# Patient Record
Sex: Male | Born: 1955 | Race: Black or African American | Hispanic: No | Marital: Married | Smoking: Never smoker
Health system: Southern US, Community
[De-identification: ages and names within clinical notes are randomized; demographics above are authoritative.]

## PROBLEM LIST (undated history)

## (undated) DIAGNOSIS — R1310 Dysphagia, unspecified: Secondary | ICD-10-CM

## (undated) DIAGNOSIS — G8250 Quadriplegia, unspecified: Secondary | ICD-10-CM

## (undated) DIAGNOSIS — J449 Chronic obstructive pulmonary disease, unspecified: Secondary | ICD-10-CM

---

## 2019-06-16 ENCOUNTER — Encounter (HOSPITAL_COMMUNITY): Payer: Self-pay | Admitting: Emergency Medicine

## 2019-06-16 ENCOUNTER — Inpatient Hospital Stay (HOSPITAL_COMMUNITY)
Admission: EM | Admit: 2019-06-16 | Discharge: 2019-06-21 | DRG: 177 | Disposition: A | Discharge status: Skilled Nursing Facility | Payer: Medicare Other | Source: Skilled Nursing Facility | Attending: Internal Medicine | Admitting: Internal Medicine

## 2019-06-16 ENCOUNTER — Telehealth: Payer: Self-pay | Admitting: Emergency Medicine

## 2019-06-16 ENCOUNTER — Other Ambulatory Visit: Payer: Self-pay

## 2019-06-16 ENCOUNTER — Emergency Department (HOSPITAL_COMMUNITY): Payer: Medicare Other

## 2019-06-16 DIAGNOSIS — U071 COVID-19: Principal | ICD-10-CM

## 2019-06-16 DIAGNOSIS — L89892 Pressure ulcer of other site, stage 2: Secondary | ICD-10-CM | POA: Diagnosis not present

## 2019-06-16 DIAGNOSIS — E118 Type 2 diabetes mellitus with unspecified complications: Secondary | ICD-10-CM | POA: Diagnosis not present

## 2019-06-16 DIAGNOSIS — Z8673 Personal history of transient ischemic attack (TIA), and cerebral infarction without residual deficits: Secondary | ICD-10-CM | POA: Diagnosis not present

## 2019-06-16 DIAGNOSIS — E870 Hyperosmolality and hypernatremia: Secondary | ICD-10-CM | POA: Diagnosis not present

## 2019-06-16 DIAGNOSIS — E1165 Type 2 diabetes mellitus with hyperglycemia: Secondary | ICD-10-CM | POA: Diagnosis present

## 2019-06-16 DIAGNOSIS — J9601 Acute respiratory failure with hypoxia: Secondary | ICD-10-CM | POA: Diagnosis present

## 2019-06-16 DIAGNOSIS — Z7982 Long term (current) use of aspirin: Secondary | ICD-10-CM | POA: Diagnosis not present

## 2019-06-16 DIAGNOSIS — G8250 Quadriplegia, unspecified: Secondary | ICD-10-CM | POA: Diagnosis not present

## 2019-06-16 DIAGNOSIS — J9600 Acute respiratory failure, unspecified whether with hypoxia or hypercapnia: Secondary | ICD-10-CM | POA: Diagnosis present

## 2019-06-16 DIAGNOSIS — L8990 Pressure ulcer of unspecified site, unspecified stage: Secondary | ICD-10-CM | POA: Diagnosis present

## 2019-06-16 DIAGNOSIS — R0902 Hypoxemia: Secondary | ICD-10-CM | POA: Diagnosis present

## 2019-06-16 DIAGNOSIS — J1289 Other viral pneumonia: Secondary | ICD-10-CM | POA: Diagnosis not present

## 2019-06-16 DIAGNOSIS — J1282 Pneumonia due to coronavirus disease 2019: Secondary | ICD-10-CM | POA: Diagnosis present

## 2019-06-16 DIAGNOSIS — J159 Unspecified bacterial pneumonia: Secondary | ICD-10-CM | POA: Diagnosis present

## 2019-06-16 DIAGNOSIS — Z8679 Personal history of other diseases of the circulatory system: Secondary | ICD-10-CM | POA: Diagnosis not present

## 2019-06-16 DIAGNOSIS — L89002 Pressure ulcer of unspecified elbow, stage 2: Secondary | ICD-10-CM | POA: Diagnosis not present

## 2019-06-16 DIAGNOSIS — J449 Chronic obstructive pulmonary disease, unspecified: Secondary | ICD-10-CM | POA: Diagnosis present

## 2019-06-16 DIAGNOSIS — Z794 Long term (current) use of insulin: Secondary | ICD-10-CM | POA: Diagnosis not present

## 2019-06-16 DIAGNOSIS — I10 Essential (primary) hypertension: Secondary | ICD-10-CM | POA: Diagnosis present

## 2019-06-16 DIAGNOSIS — R0602 Shortness of breath: Secondary | ICD-10-CM

## 2019-06-16 DIAGNOSIS — Z79899 Other long term (current) drug therapy: Secondary | ICD-10-CM | POA: Diagnosis not present

## 2019-06-16 HISTORY — DX: Chronic obstructive pulmonary disease, unspecified: J44.9

## 2019-06-16 HISTORY — DX: Dysphagia, unspecified: R13.10

## 2019-06-16 LAB — HEMOGLOBIN A1C
Hgb A1c MFr Bld: 5.9 % — ABNORMAL HIGH (ref 4.8–5.6)
Mean Plasma Glucose: 122.63 mg/dL

## 2019-06-16 LAB — COMPREHENSIVE METABOLIC PANEL
ALT: 65 U/L — ABNORMAL HIGH (ref 0–44)
AST: 118 U/L — ABNORMAL HIGH (ref 15–41)
Albumin: 3.2 g/dL — ABNORMAL LOW (ref 3.5–5.0)
Alkaline Phosphatase: 71 U/L (ref 38–126)
Anion gap: 12 (ref 5–15)
BUN: 37 mg/dL — ABNORMAL HIGH (ref 8–23)
CO2: 22 mmol/L (ref 22–32)
Calcium: 8.9 mg/dL (ref 8.9–10.3)
Chloride: 113 mmol/L — ABNORMAL HIGH (ref 98–111)
Creatinine, Ser: 1.16 mg/dL (ref 0.61–1.24)
GFR calc Af Amer: >60 mL/min (ref >60)
GFR calc non Af Amer: >60 mL/min (ref >60)
Glucose, Bld: 473 mg/dL — ABNORMAL HIGH (ref 70–99)
Potassium: 4.7 mmol/L (ref 3.5–5.1)
Sodium: 147 mmol/L — ABNORMAL HIGH (ref 135–145)
Total Bilirubin: 0.3 mg/dL (ref 0.3–1.2)
Total Protein: 7.2 g/dL (ref 6.5–8.1)

## 2019-06-16 LAB — CBC WITH DIFFERENTIAL/PLATELET
Abs Immature Granulocytes: 0.04 10*3/uL (ref 0.00–0.07)
Basophils Absolute: 0 10*3/uL (ref 0.0–0.1)
Basophils Relative: 0 %
Eosinophils Absolute: 0 10*3/uL (ref 0.0–0.5)
Eosinophils Relative: 0 %
HCT: 54.7 % — ABNORMAL HIGH (ref 39.0–52.0)
Hemoglobin: 16.2 g/dL (ref 13.0–17.0)
Immature Granulocytes: 1 %
Lymphocytes Relative: 13 %
Lymphs Abs: 0.8 10*3/uL (ref 0.7–4.0)
MCH: 24.9 pg — ABNORMAL LOW (ref 26.0–34.0)
MCHC: 29.6 g/dL — ABNORMAL LOW (ref 30.0–36.0)
MCV: 84.2 fL (ref 80.0–100.0)
Monocytes Absolute: 0.2 10*3/uL (ref 0.1–1.0)
Monocytes Relative: 3 %
Neutro Abs: 5.2 10*3/uL (ref 1.7–7.7)
Neutrophils Relative %: 83 %
Platelets: 199 10*3/uL (ref 150–400)
RBC: 6.5 MIL/uL — ABNORMAL HIGH (ref 4.22–5.81)
RDW: 18.2 % — ABNORMAL HIGH (ref 11.5–15.5)
WBC: 6.2 10*3/uL (ref 4.0–10.5)
nRBC: 0 % (ref 0.0–0.2)

## 2019-06-16 LAB — LACTIC ACID, PLASMA
Lactic Acid, Venous: 2.1 mmol/L (ref 0.5–1.9)
Lactic Acid, Venous: 2.3 mmol/L (ref 0.5–1.9)
Lactic Acid, Venous: 2 mmol/L (ref 0.5–1.9)

## 2019-06-16 LAB — HIV ANTIBODY (ROUTINE TESTING W REFLEX): HIV Screen 4th Generation wRfx: NONREACTIVE

## 2019-06-16 LAB — SAMPLE TO BLOOD BANK

## 2019-06-16 LAB — POCT I-STAT 7, (LYTES, BLD GAS, ICA,H+H)
Acid-base deficit: 1 mmol/L (ref 0.0–2.0)
Bicarbonate: 21.6 mmol/L (ref 20.0–28.0)
Calcium, Ion: 1.28 mmol/L (ref 1.15–1.40)
HCT: 46 % (ref 39.0–52.0)
Hemoglobin: 15.6 g/dL (ref 13.0–17.0)
O2 Saturation: 93 %
Potassium: 4.4 mmol/L (ref 3.5–5.1)
Sodium: 148 mmol/L — ABNORMAL HIGH (ref 135–145)
TCO2: 22 mmol/L (ref 22–32)
pCO2 arterial: 28.8 mmHg — ABNORMAL LOW (ref 32.0–48.0)
pH, Arterial: 7.483 — ABNORMAL HIGH (ref 7.350–7.450)
pO2, Arterial: 60 mmHg — ABNORMAL LOW (ref 83.0–108.0)

## 2019-06-16 LAB — C-REACTIVE PROTEIN: CRP: 8.9 mg/dL — ABNORMAL HIGH (ref <1.0)

## 2019-06-16 LAB — PROTIME-INR
INR: 1 (ref 0.8–1.2)
Prothrombin Time: 13.5 seconds (ref 11.4–15.2)

## 2019-06-16 LAB — GLUCOSE, CAPILLARY
Glucose-Capillary: 279 mg/dL — ABNORMAL HIGH (ref 70–99)
Glucose-Capillary: 183 mg/dL — ABNORMAL HIGH (ref 70–99)
Glucose-Capillary: 193 mg/dL — ABNORMAL HIGH (ref 70–99)
Glucose-Capillary: 171 mg/dL — ABNORMAL HIGH (ref 70–99)
Glucose-Capillary: 168 mg/dL — ABNORMAL HIGH (ref 70–99)

## 2019-06-16 LAB — FIBRINOGEN: Fibrinogen: 707 mg/dL — ABNORMAL HIGH (ref 210–475)

## 2019-06-16 LAB — D-DIMER, QUANTITATIVE: D-Dimer, Quant: 4.02 ug/mL-FEU — ABNORMAL HIGH (ref 0.00–0.50)

## 2019-06-16 LAB — APTT: aPTT: 32 seconds (ref 24–36)

## 2019-06-16 LAB — PROCALCITONIN: Procalcitonin: 0.38 ng/mL

## 2019-06-16 LAB — CBG MONITORING, ED: Glucose-Capillary: 338 mg/dL — ABNORMAL HIGH (ref 70–99)

## 2019-06-16 LAB — TRIGLYCERIDES: Triglycerides: 137 mg/dL (ref <150)

## 2019-06-16 LAB — FERRITIN: Ferritin: 334 ng/mL (ref 24–336)

## 2019-06-16 LAB — LACTATE DEHYDROGENASE: LDH: 744 U/L — ABNORMAL HIGH (ref 98–192)

## 2019-06-16 MED ORDER — ACETAMINOPHEN 325 MG PO TABS: 650.0000 mg | ORAL_TABLET | Freq: Once | ORAL | Status: DC

## 2019-06-16 MED ORDER — DEXTROSE-NACL 5-0.45 % IV SOLN: INTRAVENOUS | Status: DC

## 2019-06-16 MED ORDER — VANCOMYCIN HCL 2000 MG/400ML IV SOLN
2000.0000 mg | Freq: Once | INTRAVENOUS | Status: AC
  Administered 2019-06-16: 2000 mg via INTRAVENOUS
  Filled 2019-06-16: qty 400

## 2019-06-16 MED ORDER — PANTOPRAZOLE SODIUM 40 MG IV SOLR
40.0000 mg | Freq: Every day | INTRAVENOUS | Status: DC
  Administered 2019-06-16 – 2019-06-17 (×2): 40 mg via INTRAVENOUS
  Filled 2019-06-16 (×2): qty 40

## 2019-06-16 MED ORDER — CHLORHEXIDINE GLUCONATE CLOTH 2 % EX PADS
6.0000 | MEDICATED_PAD | Freq: Every day | CUTANEOUS | Status: DC
  Administered 2019-06-16: 22:00:00 6 via TOPICAL

## 2019-06-16 MED ORDER — ORAL CARE MOUTH RINSE
15.0000 mL | Freq: Two times a day (BID) | OROMUCOSAL | Status: DC
  Administered 2019-06-16 – 2019-06-21 (×9): 15 mL via OROMUCOSAL

## 2019-06-16 MED ORDER — ASPIRIN EC 81 MG PO TBEC
81.0000 mg | DELAYED_RELEASE_TABLET | Freq: Every day | ORAL | Status: DC
  Administered 2019-06-16 – 2019-06-17 (×2): 81 mg via ORAL
  Filled 2019-06-16 (×3): qty 1

## 2019-06-16 MED ORDER — SODIUM CHLORIDE 0.9 % IV SOLN
200.0000 mg | Freq: Once | INTRAVENOUS | Status: AC
  Administered 2019-06-16: 16:00:00 200 mg via INTRAVENOUS
  Filled 2019-06-16: qty 200

## 2019-06-16 MED ORDER — INSULIN REGULAR(HUMAN) IN NACL 100-0.9 UT/100ML-% IV SOLN
INTRAVENOUS | Status: DC
  Administered 2019-06-16: 5.5 [IU]/h via INTRAVENOUS
  Filled 2019-06-16: qty 100

## 2019-06-16 MED ORDER — METRONIDAZOLE IN NACL 5-0.79 MG/ML-% IV SOLN
500.0000 mg | Freq: Once | INTRAVENOUS | Status: AC
  Administered 2019-06-16: 12:00:00 500 mg via INTRAVENOUS
  Filled 2019-06-16: qty 100

## 2019-06-16 MED ORDER — BARICITINIB 2 MG PO TABS
4.0000 mg | ORAL_TABLET | Freq: Every day | ORAL | Status: DC
  Filled 2019-06-16 (×2): qty 2

## 2019-06-16 MED ORDER — DEXAMETHASONE SODIUM PHOSPHATE 10 MG/ML IJ SOLN
6.0000 mg | INTRAMUSCULAR | Status: DC
  Administered 2019-06-16 – 2019-06-21 (×6): 6 mg via INTRAVENOUS
  Filled 2019-06-16 (×7): qty 1

## 2019-06-16 MED ORDER — VANCOMYCIN HCL IN DEXTROSE 1-5 GM/200ML-% IV SOLN: 1000.0000 mg | Freq: Once | INTRAVENOUS | Status: DC

## 2019-06-16 MED ORDER — INSULIN DETEMIR 100 UNIT/ML ~~LOC~~ SOLN
10.0000 [IU] | SUBCUTANEOUS | Status: DC
  Administered 2019-06-17 (×2): 10 [IU] via SUBCUTANEOUS
  Filled 2019-06-16 (×2): qty 0.1

## 2019-06-16 MED ORDER — ENOXAPARIN SODIUM 40 MG/0.4ML ~~LOC~~ SOLN
40.0000 mg | SUBCUTANEOUS | Status: DC
  Administered 2019-06-16 – 2019-06-21 (×6): 40 mg via SUBCUTANEOUS
  Filled 2019-06-16 (×7): qty 0.4

## 2019-06-16 MED ORDER — POTASSIUM CHLORIDE 10 MEQ/100ML IV SOLN
10.0000 meq | Freq: Once | INTRAVENOUS | Status: AC
  Administered 2019-06-16: 14:00:00 10 meq via INTRAVENOUS
  Filled 2019-06-16: qty 100

## 2019-06-16 MED ORDER — ACETAMINOPHEN 650 MG RE SUPP
650.0000 mg | Freq: Once | RECTAL | Status: AC
  Administered 2019-06-16: 12:00:00 650 mg via RECTAL
  Filled 2019-06-16: qty 1

## 2019-06-16 MED ORDER — INSULIN ASPART 100 UNIT/ML ~~LOC~~ SOLN
0.0000 [IU] | SUBCUTANEOUS | Status: DC
  Administered 2019-06-17: 11 [IU] via SUBCUTANEOUS
  Administered 2019-06-17: 2 [IU] via SUBCUTANEOUS
  Administered 2019-06-17: 5 [IU] via SUBCUTANEOUS
  Administered 2019-06-17: 05:00:00 2 [IU] via SUBCUTANEOUS
  Administered 2019-06-17: 03:00:00 3 [IU] via SUBCUTANEOUS
  Administered 2019-06-17: 5 [IU] via SUBCUTANEOUS
  Administered 2019-06-17: 3 [IU] via SUBCUTANEOUS
  Administered 2019-06-18: 11 [IU] via SUBCUTANEOUS

## 2019-06-16 MED ORDER — ORAL CARE MOUTH RINSE
15.0000 mL | OROMUCOSAL | Status: DC
  Administered 2019-06-16: 15 mL via OROMUCOSAL

## 2019-06-16 MED ORDER — VANCOMYCIN HCL 750 MG/150ML IV SOLN
750.0000 mg | Freq: Two times a day (BID) | INTRAVENOUS | Status: DC
  Administered 2019-06-17 (×3): 750 mg via INTRAVENOUS
  Filled 2019-06-16 (×4): qty 150

## 2019-06-16 MED ORDER — ZINC SULFATE 220 (50 ZN) MG PO CAPS
220.0000 mg | ORAL_CAPSULE | Freq: Every day | ORAL | Status: DC
  Administered 2019-06-16 – 2019-06-21 (×6): 220 mg
  Filled 2019-06-16 (×7): qty 1

## 2019-06-16 MED ORDER — SODIUM CHLORIDE 0.9 % IV SOLN
2.0000 g | Freq: Once | INTRAVENOUS | Status: AC
  Administered 2019-06-16: 2 g via INTRAVENOUS
  Filled 2019-06-16: qty 2

## 2019-06-16 MED ORDER — CHLORHEXIDINE GLUCONATE 0.12% ORAL RINSE (MEDLINE KIT)
15.0000 mL | Freq: Two times a day (BID) | OROMUCOSAL | Status: DC
  Administered 2019-06-16 (×2): 15 mL via OROMUCOSAL

## 2019-06-16 MED ORDER — SODIUM CHLORIDE 0.9 % IV SOLN
2.0000 g | Freq: Three times a day (TID) | INTRAVENOUS | Status: AC
  Administered 2019-06-16 – 2019-06-21 (×14): 2 g via INTRAVENOUS
  Filled 2019-06-16 (×14): qty 2

## 2019-06-16 MED ORDER — ACETAMINOPHEN 325 MG PO TABS
650.0000 mg | ORAL_TABLET | Freq: Four times a day (QID) | ORAL | Status: DC | PRN
  Administered 2019-06-17: 650 mg
  Filled 2019-06-16: qty 2

## 2019-06-16 MED ORDER — SODIUM CHLORIDE 0.9 % IV SOLN: INTRAVENOUS | Status: DC

## 2019-06-16 MED ORDER — SODIUM CHLORIDE 0.9 % IV SOLN
100.0000 mg | Freq: Every day | INTRAVENOUS | Status: AC
  Administered 2019-06-17 – 2019-06-20 (×4): 100 mg via INTRAVENOUS
  Filled 2019-06-16 (×5): qty 20

## 2019-06-16 MED ORDER — ASCORBIC ACID 500 MG PO TABS
500.0000 mg | ORAL_TABLET | Freq: Every day | ORAL | Status: DC
  Administered 2019-06-16 – 2019-06-21 (×6): 500 mg
  Filled 2019-06-16 (×7): qty 1

## 2019-06-16 MED ORDER — BARICITINIB 2 MG PO TABS
4.0000 mg | ORAL_TABLET | Freq: Every day | ORAL | Status: DC
  Filled 2019-06-16: qty 2

## 2019-06-16 MED ORDER — LACTATED RINGERS IV BOLUS
1000.0000 mL | Freq: Once | INTRAVENOUS | Status: AC
  Administered 2019-06-16: 1000 mL via INTRAVENOUS

## 2019-06-16 MED ORDER — DEXTROSE 50 % IV SOLN: 0.0000 mL | INTRAVENOUS | Status: DC | PRN

## 2019-06-16 NOTE — ED Triage Notes (Signed)
Pt BIB [facility] from [facility]. EMS called out for low SpO2. Upon arrival pt SpO2 was 86% RA. Pt placed on NRB and up to 95%. Pt febrile with EMS. Pt A&Ox1 and that is normal mentation for pt. Pt tested positive 12/21. EMS also reports pt cbg 509.

## 2019-06-16 NOTE — ED Notes (Signed)
cbg 338  

## 2019-06-16 NOTE — ED Notes (Addendum)
ED TO INPATIENT HANDOFF REPORT  ED Nurse Name and Phone #: Thurmond Butts [place] Name/Age/Gender [facility] 63 y.o. male Room/Bed: 027C/027C  Code Status   Code Status: Full Code  Home/SNF/Other Skilled nursing facility Patient oriented to: self Is this baseline? Yes   Triage Complete: Triage complete  Chief Complaint Acute respiratory failure due to COVID-19 ([place]) [U07.1, J96.00]  Triage Note Pt BIB [facility] from [facility]. EMS called out for low SpO2. Upon arrival pt SpO2 was 86% RA. Pt placed on NRB and up to 95%. Pt febrile with EMS. Pt A&Ox1 and that is normal mentation for pt. Pt tested positive 12/21. EMS also reports pt cbg 509.    Allergies Not on File  Level of Care/Admitting Diagnosis ED Disposition    ED Disposition Condition [place]: [place] [[id number]]  Level of Care: ICU [6]  Covid Evaluation: Confirmed COVID Positive  Diagnosis: Acute respiratory failure due to COVID-19 [facility]) [[id number]]  Admitting Physician: Marshell Garfinkel [[id number]]  Attending Physician: Garner Nash [[id number]]  Estimated length of stay: 5 - 7 days  Certification:: I certify this patient will need inpatient services for at least 2 midnights       B Medical/Surgery History Past Medical History:  Diagnosis Date  . COPD (chronic obstructive pulmonary disease) ([place])   . Dysphagia       A IV Location/Drains/Wounds Patient Lines/Drains/Airways Status   Active Line/Drains/Airways    Name:   Placement date:   Placement time:   Site:   Days:   Peripheral IV 06/16/19 Left Hand   06/16/19    1048    Hand   less than 1          Intake/Output Last 24 hours  Intake/Output Summary (Last 24 hours) at 06/16/2019 1318 Last data filed at 06/16/2019 1154 Gross per 24 hour  Intake 100 ml  Output --  Net 100 ml    Labs/Imaging Results for orders placed or performed during the hospital encounter of 06/16/19 (from the past 48 hour(s))   Lactic acid, plasma     Status: Abnormal   Collection Time: 06/16/19 10:49 AM  Result Value Ref Range   Lactic Acid, Venous 2.1 (HH) 0.5 - 1.9 mmol/L    Comment: CRITICAL RESULT CALLED TO, READ BACK BY AND VERIFIED WITH: Janeliz Prestwood,RN AT 1130 06/16/2019 BY ZBEECH. Performed at [facility], [place] [address], [place], [place] [address]   Comprehensive metabolic panel     Status: Abnormal   Collection Time: 06/16/19 10:49 AM  Result Value Ref Range   Sodium 147 (H) 135 - 145 mmol/L   Potassium 4.7 3.5 - 5.1 mmol/L   Chloride 113 (H) 98 - 111 mmol/L   CO2 22 22 - 32 mmol/L   Glucose, Bld 473 (H) 70 - 99 mg/dL   BUN 37 (H) 8 - 23 mg/dL   Creatinine, Ser 1.16 0.61 - 1.24 mg/dL   Calcium 8.9 8.9 - 10.3 mg/dL   Total Protein 7.2 6.5 - 8.1 g/dL   Albumin 3.2 (L) 3.5 - 5.0 g/dL   AST 118 (H) 15 - 41 U/L   ALT 65 (H) 0 - 44 U/L   Alkaline Phosphatase 71 38 - 126 U/L   Total Bilirubin 0.3 0.3 - 1.2 mg/dL   GFR calc non Af Amer >60 >60 mL/min   GFR calc Af Amer >60 >60 mL/min   Anion gap 12 5 - 15  Comment: Performed at [facility], [place] [address], [place], [place] [address]  CBC WITH DIFFERENTIAL     Status: Abnormal   Collection Time: 06/16/19 10:49 AM  Result Value Ref Range   WBC 6.2 4.0 - 10.5 K/uL   RBC 6.50 (H) 4.22 - 5.81 MIL/uL   Hemoglobin 16.2 13.0 - 17.0 g/dL   HCT 54.7 (H) 39.0 - 52.0 %   MCV 84.2 80.0 - 100.0 fL   MCH 24.9 (L) 26.0 - 34.0 pg   MCHC 29.6 (L) 30.0 - 36.0 g/dL   RDW 18.2 (H) 11.5 - 15.5 %   Platelets 199 150 - 400 K/uL   nRBC 0.0 0.0 - 0.2 %   Neutrophils Relative % 83 %   Neutro Abs 5.2 1.7 - 7.7 K/uL   Lymphocytes Relative 13 %   Lymphs Abs 0.8 0.7 - 4.0 K/uL   Monocytes Relative 3 %   Monocytes Absolute 0.2 0.1 - 1.0 K/uL   Eosinophils Relative 0 %   Eosinophils Absolute 0.0 0.0 - 0.5 K/uL   Basophils Relative 0 %   Basophils Absolute 0.0 0.0 - 0.1 K/uL   Immature Granulocytes 1 %   Abs Immature Granulocytes 0.04 0.00 - 0.07 K/uL     Comment: Performed at [facility], [address], [place], [place] [address]  APTT     Status: None   Collection Time: 06/16/19 10:49 AM  Result Value Ref Range   aPTT 32 24 - 36 seconds    Comment: Performed at [place] [address] [place], [place] [address]  Protime-INR     Status: None   Collection Time: 06/16/19 10:49 AM  Result Value Ref Range   Prothrombin Time 13.5 11.4 - 15.2 seconds   INR 1.0 0.8 - 1.2    Comment: (NOTE) INR goal varies based on device and disease states. Performed at [facility], [place] [address], [place], [place] [address]   I-STAT 7, (LYTES, BLD GAS, ICA, H+H)     Status: Abnormal   Collection Time: 06/16/19 11:40 AM  Result Value Ref Range   pH, Arterial 7.483 (H) 7.350 - 7.450   pCO2 arterial 28.8 (L) 32.0 - 48.0 mmHg   pO2, Arterial 60.0 (L) 83.0 - 108.0 mmHg   Bicarbonate 21.6 20.0 - 28.0 mmol/L   TCO2 22 22 - 32 mmol/L   O2 Saturation 93.0 %   Acid-base deficit 1.0 0.0 - 2.0 mmol/L   Sodium 148 (H) 135 - 145 mmol/L   Potassium 4.4 3.5 - 5.1 mmol/L   Calcium, Ion 1.28 1.15 - 1.40 mmol/L   HCT 46.0 39.0 - 52.0 %   Hemoglobin 15.6 13.0 - 17.0 g/dL   Patient temperature HIDE    Sample type ARTERIAL    DG Chest Port 1 View  Result Date: 06/16/2019 CLINICAL DATA:  History of COVID, altered mental status, COVID positive EXAM: PORTABLE CHEST 1 VIEW COMPARISON:  No pertinent prior studies available for comparison. FINDINGS: Limited evaluation due to artifacts overlying the right hemithorax. Additionally, the patient's chin obscures the right lung apex and the patient is significantly rotated to the right. Heart size within normal limits. Ill-defined airspace opacities throughout both lungs. No evidence of pneumothorax within described limitations. No sizable pleural effusion. No acute bony abnormality. IMPRESSION: Limited examination as described. Ill-defined airspace opacities throughout both lungs consistent with  pneumonia given provided history. Electronically Signed   By: Kellie Simmering DO   On:  06/16/2019 11:12    Pending Labs Unresulted Labs (From admission, onward)    Start     Ordered   06/23/19 0500  Creatinine, serum  (enoxaparin (LOVENOX)    CrCl >/= 30 ml/min)  Weekly,   R    Comments: while on enoxaparin therapy    06/16/19 1228   06/17/19 0500  CBC with Differential/Platelet  Daily,   R     06/16/19 1228   06/17/19 0500  Comprehensive metabolic panel  Daily,   R     06/16/19 1228   06/17/19 0500  C-reactive protein  Daily,   R     06/16/19 1228   06/17/19 0500  D-dimer, quantitative (not at [facility])  Daily,   R     06/16/19 1228   06/17/19 0500  Ferritin  Daily,   R     06/16/19 1228   06/17/19 0500  Magnesium  Daily,   R     06/16/19 1228   06/17/19 0500  Procalcitonin  Daily,   R     06/16/19 1249   06/16/19 1231  Hemoglobin A1c  Once,   STAT     06/16/19 1230   06/16/19 1225  Respiratory Panel by PCR  Once,   STAT    Question:  Patient immune status  Answer:  Immunocompromised   06/16/19 1228   06/16/19 1225  Type and screen [place]  Once,   STAT    Comments: [place]    06/16/19 1228   06/16/19 1221  HIV Antibody (routine testing w rflx)  (HIV Antibody (Routine testing w reflex) panel)  Once,   STAT     06/16/19 1228   06/16/19 1221  ABO/Rh  Once,   STAT     06/16/19 1228   06/16/19 1206  D-dimer, quantitative  ONCE - STAT,   STAT    Comments: Used for prognosis and bed placement. Do not order CT or V/Q.    06/16/19 1206   06/16/19 1206  Procalcitonin  ONCE - STAT,   STAT     06/16/19 1206   06/16/19 1206  Lactate dehydrogenase  Once,   STAT     06/16/19 1206   06/16/19 1206  Ferritin  Once,   STAT     06/16/19 1206   06/16/19 1206  Triglycerides  Once,   STAT     06/16/19 1206   06/16/19 1206  Fibrinogen  Once,   STAT     06/16/19 1206   06/16/19 1206  C-reactive protein  Once,   STAT     06/16/19 1206   06/16/19 1033   Blood Culture (routine x 2)  BLOOD CULTURE X 2,   STAT     06/16/19 1033   06/16/19 1033  Urinalysis, Routine w reflex microscopic  ONCE - STAT,   STAT     06/16/19 1033   06/16/19 1033  Urine culture  ONCE - STAT,   STAT     06/16/19 1033          Vitals/Pain Today's Vitals   06/16/19 1145 06/16/19 1200 06/16/19 1215 06/16/19 1230  BP: 110/73 120/75 117/81 114/66  Pulse: (!) 107 (!) 106    Resp: (!) 41 (!) 42 (!) 33 (!) 36  Temp:      TempSrc:      SpO2: 95% 96%    Weight:      Height:      PainSc:  Isolation Precautions Airborne and Contact precautions  Medications Medications  vancomycin (VANCOREADY) IVPB 2000 mg/400 mL (2,000 mg Intravenous New Bag/Given 06/16/19 1206)  ceFEPIme (MAXIPIME) 2 g in sodium chloride 0.9 % 100 mL IVPB (has no administration in time range)  vancomycin (VANCOREADY) IVPB 750 mg/150 mL (has no administration in time range)  lactated ringers bolus 1,000 mL (has no administration in time range)  potassium chloride 10 mEq in 100 mL IVPB (has no administration in time range)  enoxaparin (LOVENOX) injection 40 mg (has no administration in time range)  chlorhexidine gluconate (MEDLINE KIT) (PERIDEX) 0.12 % solution 15 mL (has no administration in time range)  MEDLINE mouth rinse (has no administration in time range)  remdesivir 200 mg in sodium chloride 0.9% 250 mL IVPB (has no administration in time range)    Followed by  remdesivir 100 mg in sodium chloride 0.9 % 100 mL IVPB (has no administration in time range)  dexamethasone (DECADRON) injection 6 mg (has no administration in time range)  ascorbic acid (VITAMIN C) tablet 500 mg (has no administration in time range)  zinc sulfate capsule 220 mg (has no administration in time range)  pantoprazole (PROTONIX) injection 40 mg (has no administration in time range)  acetaminophen (TYLENOL) tablet 650 mg (has no administration in time range)  aspirin EC tablet 81 mg (has no administration in time  range)  insulin regular, human (MYXREDLIN) 100 units/ 100 mL infusion (has no administration in time range)  0.9 %  sodium chloride infusion (has no administration in time range)  dextrose 5 %-0.45 % sodium chloride infusion (has no administration in time range)  dextrose 50 % solution 0-50 mL (has no administration in time range)  baricitinib (OLUMIANT) 2 MG tablet 4 mg (has no administration in time range)  ceFEPIme (MAXIPIME) 2 g in sodium chloride 0.9 % 100 mL IVPB (0 g Intravenous Stopped 06/16/19 1154)  metroNIDAZOLE (FLAGYL) IVPB 500 mg (500 mg Intravenous New Bag/Given 06/16/19 1205)  acetaminophen (TYLENOL) suppository 650 mg (650 mg Rectal Given 06/16/19 1212)    Mobility non-ambulatory High fall risk   Focused Assessments    R Recommendations: See Admitting Provider Note  Report given to: Emelia Loron RN  Additional Notes:

## 2019-06-16 NOTE — Progress Notes (Addendum)
Notified provider of need to order repeat lactic acid. ° °

## 2019-06-16 NOTE — Progress Notes (Signed)
Notified bedside nurse & MD  of need to order and draw repeat lactic acid.

## 2019-06-16 NOTE — Progress Notes (Signed)
BM evaluated patient quadriplegic, nonverbal.  Resting comfortably no longer on nonrebreather.  Formal admission orders and H&P by Dr. Vaughan Browner PCCM

## 2019-06-16 NOTE — Progress Notes (Signed)
Mrs. Billy Nelson called and was updated about her husbands current medical status. Informed her is on 6L [place] and also discussed his elevated blood sugar. Informed Mrs. Billy Nelson that Mr. Billy Nelson may possible be discharged from the ICU within [place] if he continues making progress.

## 2019-06-16 NOTE — ED Provider Notes (Signed)
[facility] EM[facility]ENCY DEPARTMENT Provider Note   CSN: [id number] Arrival date & time: 06/16/19  1002     History Chief Complaint  Patient presents with  . Shortness of Breath  . Covid-19    Billy Nelson is a 63 y.o. male.  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  EMS reports that his sats were 85% on no oxygen when they arrived.  He was transported here on a nonrebreather and sats have been in the mid 90s.  Review of records from nursing home reveals next of kin Billy Nelson, wife, phone [phone].  I attempted to contact her and had no answer.  Second next of kin was listed as key area right, daughter phone #[phone] I called and left a message.  Review of records does not show any evidence of DNR or other treatment plan.     11:14 AM Connected with wife, Billy Nelson, via phone.  She states that patient is a full code and she wishes intubation if needed.  No past medical history on file.  There are no problems to display for this patient.    The histories are not reviewed yet. Please review them in the "History" navigator section and refresh this SmartLink.     No family history on file.  Social History   Tobacco Use  . Smoking status: Not on file  Substance Use Topics  . Alcohol use: Not on file  . Drug use: Not on file    Home Medications Prior to Admission medications   Not on File    Allergies    Patient has no allergy information on record.  Review of Systems   Review of Systems  Unable to perform ROS: Acuity of condition    Physical Exam Updated Vital Signs BP 104/76 (BP Location: Left Arm)   Pulse (!) 104   Temp (!) 103.9 F (39.9  C) (Axillary)   Resp (!) 52   SpO2 94%   Physical Exam Vitals and nursing note reviewed.  Constitutional:      General: He is in acute distress.     Appearance: He is ill-appearing.     Comments: Chronically ill-appearing with all 4 extremities contractured Skin is warm to touch Temp is 103.9 axillary  HENT:     Head:     Comments: Deformity right side of skull c.w. probable prior craniotomy    Mouth/Throat:     Comments: Mucous membranes appear dry Cardiovascular:     Rate and Rhythm: Regular rhythm. Tachycardia present.  Pulmonary:     Comments: Patient with increased respiratory rate up into the 50s Decreased breath sounds bilateral bases Rhonchi diffusely Abdominal:     Palpations: Abdomen is soft.     Comments: G-tube in place Abdomen is nondistended   Musculoskeletal:     Comments: Skin breakdown noted left hip and sacral area but no evidence of deep tissue infection or cellulitis  Skin:    General: Skin is warm.     Capillary Refill: Capillary refill takes less than 2 seconds.  Neurological:     Mental Status: He is alert.     Comments: Patient is nonverbal here.  He does open his eyes to command  ED Results / Procedures / Treatments   Labs (all labs ordered are listed, but only abnormal results are displayed) Labs Reviewed  CULTURE, BLOOD (ROUTINE X 2)  CULTURE, BLOOD (ROUTINE X 2)  URINE CULTURE  LACTIC ACID, PLASMA  LACTIC ACID, PLASMA  COMPREHENSIVE METABOLIC PANEL  CBC WITH DIFFERENTIAL/PLATELET  APTT  PROTIME-INR  URINALYSIS, ROUTINE W REFLEX MICROSCOPIC  I-STAT ARTERIAL BLOOD GAS, ED    EKG None  Radiology No results found.  Procedures .Critical Care Performed by: Pattricia Boss, MD Authorized by: Pattricia Boss, MD   Critical care provider statement:    Critical care time (minutes):  45   Critical care was necessary to treat or prevent imminent or life-threatening deterioration of the following conditions:  Respiratory failure and  sepsis   Critical care was time spent personally by me on the following activities:  Discussions with consultants, evaluation of patient's response to treatment, examination of patient, ordering and performing treatments and interventions, ordering and review of laboratory studies, ordering and review of radiographic studies, pulse oximetry, re-evaluation of patient's condition, obtaining history from patient or surrogate and review of old charts   (including critical care time)  Medications Ordered in ED Medications  acetaminophen (TYLENOL) tablet 650 mg (has no administration in time range)  ceFEPIme (MAXIPIME) 2 g in sodium chloride 0.9 % 100 mL IVPB (has no administration in time range)  metroNIDAZOLE (FLAGYL) IVPB 500 mg (has no administration in time range)  vancomycin (VANCOCIN) IVPB 1000 mg/200 mL premix (has no administration in time range)    ED Course  I have reviewed the triage vital signs and the nursing notes.  Pertinent labs & imaging results that were available during my care of the patient were reviewed by me and considered in my medical decision making (see chart for details). 62 yo male s/p ich, positive covid test December 21 presents today with respiratory distress.  Cultures and labs obtained with broad spectrum abx as patient debilitated with high fever.   CXR consistent with multiple opacities likely secondary to his covid infection.  Sepsis and bacterial etiology less likely with this information.  Lactic acid 2.1- patient likely volume depleted and one liter LR given.   Antipyretics given-awaiting repeat temperature when temp Foley placed.  However respiratory rate has decreased to the 30s.  Patient heart rate currently at 104 with blood pressure 126/79. ABG obtained with pH 7.48, PCO2 28, and PO2 60. Glucose 473.  Plan recheck CBG after first liter infused Discussed with Dr. Vaughan Browner and critical care will see in consult [facility] was evaluated in Emergency  Department on 06/17/2019 for the symptoms described in the history of present illness. He was evaluated in the context of the global COVID-19 pandemic, which necessitated consideration that the patient might be at risk for infection with the SARS-CoV-2 virus that causes COVID-19. Institutional protocols and algorithms that pertain to the evaluation of patients at risk for COVID-19 are in a state of rapid change based on information released by regulatory bodies including the CDC and federal and state organizations. These policies and algorithms were followed during the patient's care in the ED.    MDM Rules/Calculators/A&P                       Final Clinical Impression(s) / ED Diagnoses Final diagnoses:  COVID-19 virus infection    Rx / DC Orders ED Discharge Orders    None       Pattricia Boss, MD  06/17/19 1405  

## 2019-06-16 NOTE — H&P (Addendum)
NAME:  Billy Nelson, MRN:  [id number], DOB:  1956/03/07, LOS: 0 ADMISSION DATE:  06/16/2019, CONSULTATION DATE:  06/16/2019 REFERRING MD:  EDP, CHIEF COMPLAINT:  COVID 19 pneumonia  Brief History   63 year old with quadriplegia secondary to intracranial hemorrhage, diabetes, hypertension Diagnosed with COVID-19 infection on 12/21 at [facility] nursing home Sent to the ED on 12/28 for worsening hypoxia, respiratory distress Placed on nonrebreather and PCCM called for admission  Past Medical History  ICH, paraplegia, DM, HTN  Significant Hospital Events   12/21-COVID-19 positive 12/28-Admit  Consults:  PCCM  Procedures:  Chest x-ray 06/16/2019-bilateral infiltrates  Significant Diagnostic Tests:    Micro Data:  Bcx 12/28 >>  Antimicrobials/ COVID Rx  Vancomycin 12/28 >> Cefepime 12/28 >> Flagyl X 1 12/28  Barcitinib 12/28 >> Remdesivir 12/28 >> Decadron 12/28 >>  Interim history/subjective:    Objective   Blood pressure 114/66, pulse (!) 106, temperature (!) 103.9 F (39.9 C), temperature source Axillary, resp. rate (!) 36, height 6' (1.829 m), weight 77.1 kg, SpO2 96 %.        Intake/Output Summary (Last 24 hours) at 06/16/2019 1314 Last data filed at 06/16/2019 1154 Gross per 24 hour  Intake 100 ml  Output --  Net 100 ml   Filed Weights   06/16/19 1053  Weight: 77.1 kg    Examination: Blood pressure 114/66, pulse (!) 106, temperature (!) 103.9 F (39.9 C), temperature source Axillary, resp. rate (!) 36, height 6' (1.829 m), weight 77.1 kg, SpO2 96 %. Gen:      No acute distress, chronically ill appearing HEENT:  EOMI, sclera anicteric Neck:     No masses; no thyromegaly Lungs:    Clear to auscultation bilaterally; normal respiratory effort CV:         Regular rate and rhythm; no murmurs Abd:      + bowel sounds; soft, non-tender; no palpable masses, no distension, G tube Ext:    No edema; adequate peripheral perfusion Skin:      Warm and dry;  no rash Neuro: [facility] Problem list     Assessment & Plan:  Severe COVID-19 pneumonia Admitted to ICU  High flow nasal cannula as needed Will be difficult to do awake proning or incentive spirometer given his neuro status.  Treat with Remdesivir, Decadron Start him on barcitinib given recent data showing improvement in clinical status and reduced progression to vent and death with this therapy.  Low suspicion for bacterial infection.  Continue broad antibiotic coverage for now pending cultures and procalcitonin  Diabetes, hyperglycemia Insulin drip  Best practice:  Diet: NPO Pain/Anxiety/Delirium protocol (if indicated): NA VAP protocol (if indicated): NA DVT prophylaxis: Lovenox GI prophylaxis: NA Glucose control: Insulin drip Mobility: Bed Code Status: Full Family Communication: Discussed with wife over telephone.  Reviewed this treatment plan and plan for admission to [facility]. Code status discussed and she requested full code.  Disposition: ICU  Labs   CBC: Recent Labs  Lab 06/16/19 1049 06/16/19 1140  WBC 6.2  --   NEUTROABS 5.2  --   HGB 16.2 15.6  HCT 54.7* 46.0  MCV 84.2  --   PLT 199  --     Basic Metabolic Panel: Recent Labs  Lab 06/16/19 1049 06/16/19 1140  NA 147* 148*  K 4.7 4.4  CL 113*  --   CO2 22  --   GLUCOSE 473*  --   BUN 37*  --   CREATININE 1.16  --  CALCIUM 8.9  --    GFR: Estimated Creatinine Clearance: 72 mL/min (by C-G formula based on SCr of 1.16 mg/dL). Recent Labs  Lab 06/16/19 1049  WBC 6.2  LATICACIDVEN 2.1*    Liver Function Tests: Recent Labs  Lab 06/16/19 1049  AST 118*  ALT 65*  ALKPHOS 71  BILITOT 0.3  PROT 7.2  ALBUMIN 3.2*   No results for input(s): LIPASE, AMYLASE in the last 168 hours. No results for input(s): AMMONIA in the last 168 hours.  ABG    Component Value Date/Time   PHART 7.483 (H) 06/16/2019 1140   PCO2ART 28.8 (L) 06/16/2019 1140   PO2ART 60.0  (L) 06/16/2019 1140   HCO3 21.6 06/16/2019 1140   TCO2 22 06/16/2019 1140   ACIDBASEDEF 1.0 06/16/2019 1140   O2SAT 93.0 06/16/2019 1140     Coagulation Profile: Recent Labs  Lab 06/16/19 1049  INR 1.0    Cardiac Enzymes: No results for input(s): CKTOTAL, CKMB, CKMBINDEX, TROPONINI in the last 168 hours.  HbA1C: No results found for: HGBA1C  CBG: No results for input(s): GLUCAP in the last 168 hours.  Review of Systems:   Unable to obtain due to altered mental  Past Medical History  He,  has a past medical history of COPD (chronic obstructive pulmonary disease) ([place]) and Dysphagia.     Social History      Family History   His family history is not on file.   Allergies Not on File   Home Medications  Prior to Admission medications   Not on File     Critical care time:    The patient is critically ill with multiple organ system failure and requires high complexity decision making for assessment and support, frequent evaluation and titration of therapies, advanced monitoring, review of radiographic studies and interpretation of complex data.   Critical Care Time devoted to patient care services, exclusive of separately billable procedures, described in this note is 35 minutes.   Marshell Garfinkel MD  Pulmonary and Critical Care Please see [email or web address] for pager details.  06/16/2019, 1:26 PM

## 2019-06-16 NOTE — Progress Notes (Addendum)
Pharmacy Antibiotic Note  JOSYAH ACHOR is a 63 y.o. male admitted from nursing home with history of ICH, paraplegia on 06/16/2019 with sepsis - unknown source.  Pharmacy has been consulted for vancomycin/cefepime dosing. Tmax/24h 103.9, WBC wnl, LA 2.1. SCr 1.16 on admit (last documented SCr 0.65 from 11/2018 from NH information). Estimated height 6' and weight 170 lbs in the ED per discussion with RN.  Reviewed NH information from paper chart sent with patient - does not appear patient was on antibiotics PTA.  Plan: Cefepime 2g IV x 1; then 2g IV q8h Vancomycin 2g IV x1; then Vancomycin 750 mg IV Q 12 hrs. Goal AUC 400-550. Expected AUC: 421 SCr used: 1.16 Flagyl 500mg  IV x 1 per EDP - f/u if to continue Monitor clinical progress, c/s, renal function F/u de-escalation plan/LOT, vancomycin levels at steady state      Temp (24hrs), Avg:103.9 F (39.9 C), Min:103.9 F (39.9 C), Max:103.9 F (39.9 C)  No results for input(s): WBC, CREATININE, LATICACIDVEN, VANCOTROUGH, VANCOPEAK, VANCORANDOM, GENTTROUGH, GENTPEAK, GENTRANDOM, TOBRATROUGH, TOBRAPEAK, TOBRARND, AMIKACINPEAK, AMIKACINTROU, AMIKACIN in the last 168 hours.  CrCl cannot be calculated (No successful lab value found.).    Not on File  Antimicrobials this admission: 12/28 vancomycin >>  12/28 cefepime >>  12/28 flagyl x 1  Dose adjustments this admission:   Microbiology results:   Elicia Lamp, PharmD, BCPS Clinical Pharmacist 06/16/2019 10:35 AM

## 2019-06-17 ENCOUNTER — Encounter (HOSPITAL_COMMUNITY): Payer: Self-pay | Admitting: Pulmonary Disease

## 2019-06-17 DIAGNOSIS — G8250 Quadriplegia, unspecified: Secondary | ICD-10-CM | POA: Diagnosis present

## 2019-06-17 DIAGNOSIS — I10 Essential (primary) hypertension: Secondary | ICD-10-CM | POA: Diagnosis present

## 2019-06-17 DIAGNOSIS — J1289 Other viral pneumonia: Secondary | ICD-10-CM

## 2019-06-17 DIAGNOSIS — E870 Hyperosmolality and hypernatremia: Secondary | ICD-10-CM

## 2019-06-17 DIAGNOSIS — J159 Unspecified bacterial pneumonia: Secondary | ICD-10-CM | POA: Diagnosis present

## 2019-06-17 DIAGNOSIS — J1282 Pneumonia due to coronavirus disease 2019: Secondary | ICD-10-CM | POA: Diagnosis present

## 2019-06-17 DIAGNOSIS — E118 Type 2 diabetes mellitus with unspecified complications: Secondary | ICD-10-CM | POA: Diagnosis present

## 2019-06-17 DIAGNOSIS — L8990 Pressure ulcer of unspecified site, unspecified stage: Secondary | ICD-10-CM | POA: Diagnosis present

## 2019-06-17 DIAGNOSIS — J9601 Acute respiratory failure with hypoxia: Secondary | ICD-10-CM | POA: Diagnosis present

## 2019-06-17 DIAGNOSIS — Z8679 Personal history of other diseases of the circulatory system: Secondary | ICD-10-CM

## 2019-06-17 LAB — COMPREHENSIVE METABOLIC PANEL
ALT: 51 U/L — ABNORMAL HIGH (ref 0–44)
AST: 90 U/L — ABNORMAL HIGH (ref 15–41)
Albumin: 2.8 g/dL — ABNORMAL LOW (ref 3.5–5.0)
Alkaline Phosphatase: 58 U/L (ref 38–126)
Anion gap: 10 (ref 5–15)
BUN: 28 mg/dL — ABNORMAL HIGH (ref 8–23)
CO2: 22 mmol/L (ref 22–32)
Calcium: 8.4 mg/dL — ABNORMAL LOW (ref 8.9–10.3)
Chloride: 120 mmol/L — ABNORMAL HIGH (ref 98–111)
Creatinine, Ser: 0.87 mg/dL (ref 0.61–1.24)
GFR calc Af Amer: >60 mL/min (ref >60)
GFR calc non Af Amer: >60 mL/min (ref >60)
Glucose, Bld: 173 mg/dL — ABNORMAL HIGH (ref 70–99)
Potassium: 4.8 mmol/L (ref 3.5–5.1)
Sodium: 152 mmol/L — ABNORMAL HIGH (ref 135–145)
Total Bilirubin: 0.7 mg/dL (ref 0.3–1.2)
Total Protein: 6.2 g/dL — ABNORMAL LOW (ref 6.5–8.1)

## 2019-06-17 LAB — CBC WITH DIFFERENTIAL/PLATELET
Abs Immature Granulocytes: 0 10*3/uL (ref 0.00–0.07)
Band Neutrophils: 22 %
Basophils Absolute: 0 10*3/uL (ref 0.0–0.1)
Basophils Relative: 0 %
Eosinophils Absolute: 0 10*3/uL (ref 0.0–0.5)
Eosinophils Relative: 0 %
HCT: 45.6 % (ref 39.0–52.0)
Hemoglobin: 14.1 g/dL (ref 13.0–17.0)
Lymphocytes Relative: 10 %
Lymphs Abs: 0.8 10*3/uL (ref 0.7–4.0)
MCH: 25.4 pg — ABNORMAL LOW (ref 26.0–34.0)
MCHC: 30.9 g/dL (ref 30.0–36.0)
MCV: 82 fL (ref 80.0–100.0)
Monocytes Absolute: 0.2 10*3/uL (ref 0.1–1.0)
Monocytes Relative: 3 %
Neutro Abs: 6.5 10*3/uL (ref 1.7–7.7)
Neutrophils Relative %: 65 %
Platelets: 194 10*3/uL (ref 150–400)
RBC: 5.56 MIL/uL (ref 4.22–5.81)
RDW: 17.1 % — ABNORMAL HIGH (ref 11.5–15.5)
WBC Morphology: INCREASED
WBC: 7.5 10*3/uL (ref 4.0–10.5)
nRBC: 0 % (ref 0.0–0.2)

## 2019-06-17 LAB — GLUCOSE, CAPILLARY
Glucose-Capillary: 138 mg/dL — ABNORMAL HIGH (ref 70–99)
Glucose-Capillary: 159 mg/dL — ABNORMAL HIGH (ref 70–99)
Glucose-Capillary: 161 mg/dL — ABNORMAL HIGH (ref 70–99)
Glucose-Capillary: 171 mg/dL — ABNORMAL HIGH (ref 70–99)
Glucose-Capillary: 174 mg/dL — ABNORMAL HIGH (ref 70–99)
Glucose-Capillary: 204 mg/dL — ABNORMAL HIGH (ref 70–99)
Glucose-Capillary: 228 mg/dL — ABNORMAL HIGH (ref 70–99)
Glucose-Capillary: 230 mg/dL — ABNORMAL HIGH (ref 70–99)
Glucose-Capillary: 309 mg/dL — ABNORMAL HIGH (ref 70–99)

## 2019-06-17 LAB — D-DIMER, QUANTITATIVE: D-Dimer, Quant: 1.51 ug/mL-FEU — ABNORMAL HIGH (ref 0.00–0.50)

## 2019-06-17 LAB — FERRITIN: Ferritin: 292 ng/mL (ref 24–336)

## 2019-06-17 LAB — PHOSPHORUS: Phosphorus: 2.6 mg/dL (ref 2.5–4.6)

## 2019-06-17 LAB — C-REACTIVE PROTEIN: CRP: 13.9 mg/dL — ABNORMAL HIGH (ref <1.0)

## 2019-06-17 LAB — PROCALCITONIN: Procalcitonin: 0.54 ng/mL

## 2019-06-17 LAB — MAGNESIUM: Magnesium: 2.2 mg/dL (ref 1.7–2.4)

## 2019-06-17 LAB — MRSA PCR SCREENING: MRSA by PCR: NEGATIVE

## 2019-06-17 MED ORDER — CHLORHEXIDINE GLUCONATE CLOTH 2 % EX PADS
6.0000 | MEDICATED_PAD | Freq: Every morning | CUTANEOUS | Status: DC
  Administered 2019-06-17 – 2019-06-21 (×5): 6 via TOPICAL

## 2019-06-17 MED ORDER — ORAL CARE MOUTH RINSE
15.0000 mL | Freq: Two times a day (BID) | OROMUCOSAL | Status: DC
  Administered 2019-06-17 – 2019-06-19 (×6): 15 mL via OROMUCOSAL

## 2019-06-17 MED ORDER — JEVITY 1.2 CAL PO LIQD: 1000.0000 mL | ORAL | Status: DC

## 2019-06-17 MED ORDER — IPRATROPIUM-ALBUTEROL 20-100 MCG/ACT IN AERS
1.0000 | INHALATION_SPRAY | Freq: Four times a day (QID) | RESPIRATORY_TRACT | Status: DC
  Administered 2019-06-17 – 2019-06-21 (×11): 1 via RESPIRATORY_TRACT
  Filled 2019-06-17: qty 4

## 2019-06-17 MED ORDER — OSMOLITE 1.5 CAL PO LIQD
1000.0000 mL | ORAL | Status: DC
  Administered 2019-06-17 – 2019-06-19 (×2): 1000 mL
  Filled 2019-06-17 (×7): qty 1000

## 2019-06-17 MED ORDER — FREE WATER
200.0000 mL | Freq: Three times a day (TID) | Status: DC
  Administered 2019-06-17 – 2019-06-18 (×4): 200 mL

## 2019-06-17 MED ORDER — PRO-STAT SUGAR FREE PO LIQD
30.0000 mL | Freq: Every day | ORAL | Status: DC
  Administered 2019-06-17 – 2019-06-21 (×5): 30 mL
  Filled 2019-06-17 (×5): qty 30

## 2019-06-17 MED ORDER — CHLORHEXIDINE GLUCONATE 0.12 % MT SOLN
15.0000 mL | Freq: Two times a day (BID) | OROMUCOSAL | Status: DC
  Administered 2019-06-17 – 2019-06-21 (×9): 15 mL via OROMUCOSAL
  Filled 2019-06-17 (×8): qty 15

## 2019-06-17 NOTE — Progress Notes (Signed)
Inpatient Diabetes Program Recommendations  AACE/ADA: New Consensus Statement on Inpatient Glycemic Control (2015)  Target Ranges:  Prepandial:   less than 140 mg/dL      Peak postprandial:   less than 180 mg/dL (1-2 hours)      Critically ill patients:  140 - 180 mg/dL   Lab Results  Component Value Date   GLUCAP 159 (H) 06/17/2019   HGBA1C 5.9 (H) 06/16/2019    Review of Glycemic Control Results for TASEAN, MANCHA (MRN [id number]) as of 06/17/2019 10:30  Ref. Range 06/16/2019 23:12 06/17/2019 02:34 06/17/2019 07:38  Glucose-Capillary Latest Ref Range: 70 - 99 mg/dL 168 (H) 171 (H) 159 (H)   Diabetes history: None Outpatient Diabetes medications: None Current orders for Inpatient glycemic control:  Levemir 10 units q 24 hours Novolog moderate q 4 hours Decadron 6 mg daily Inpatient Diabetes Program Recommendations:   Note patient on insulin drip overnight.  Blood sugar improved this AM. If blood sugar increases>180 mg/dL may need to increase Levemir to 10 units bid.   Thanks  Adah Perl, RN, BC-ADM Inpatient Diabetes Coordinator Pager [phone] (8a-5p)

## 2019-06-17 NOTE — Progress Notes (Signed)
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  Spontaneously moves right arm.  Pulls left arm away when disturbed.   Assessment & Plan:   Active Problems:   Acute respiratory failure due to COVID-19 (HCC)   Pressure injury of skin   Pneumonia due to COVID-19 virus   Diabetes mellitus type 2, controlled, with complications (HCC)   Quadriplegia (HCC)   History of intracranial hemorrhage   Essential hypertension   Acute respiratory failure with hypoxia (HCC)   Bacterial pneumonia   Hypernatremia   Pressure ulcer   Covid pneumonia/acute respiratory failure with hypoxia COVID-19 Labs  Recent Labs    06/16/19 1320 06/17/19 0637  DDIMER 4.02* 1.51*  FERRITIN 334 292  LDH 744*  --   CRP 8.9* 13.9*    12/21 Covid coronavirus positive at [facility] -12/28 Barcitinib--> 12/29 DC'd -Remdesivir per pharmacy protocol -Decadron 6 mg daily -Combivent QID -Vitamins per Covid protocol -Titrate O2 to maintain SPO2> 88% -Flutter valve -Incentive spirometer  Bacterial pneumonia superinfection? -Trend procalcitonin; slightly elevated, with lactic acidosis and fever will continue cefepime x5 days. -DC vancomycin  Hx intracranial hemorrhage/quadriplegia -Quadriplegic, nonverbal.  Does withdraw to painful stimuli  Diabetes type 2 controlled with complication -12/28 hemoglobin A1c= 5.9 -Levemir 10 units daily -Moderate SSI  Essential HTN  Hypernatremia -Free water TID  Pressure ulcer Pressure Injury 06/16/19  Coccyx Healed (Active)  06/16/19 1649  Location: Coccyx  Location Orientation:   Staging:   Wound Description (Comments): Healed  Present on Admission: Yes     Pressure Injury 06/16/19 Knee Anterior;Left;Medial Stage 2 -  Partial thickness loss of dermis presenting as a shallow open injury with a red, pink wound bed without slough. (Active)  06/16/19 1651  Location: Knee  Location Orientation: Anterior;Left;Medial  Staging: Stage 2 -  Partial thickness loss of dermis presenting as a shallow open injury with a red, pink wound bed without slough.  Wound Description (Comments):   Present on Admission: Yes     Pressure Injury 06/16/19 Knee Anterior;Right;Medial Deep Tissue Pressure Injury - Purple or maroon localized area of discolored intact skin or blood-filled blister due to damage of underlying soft tissue from pressure and/or shear. (Active)  06/16/19 1759  Location: Knee  Location Orientation: Anterior;Right;Medial  Staging: Deep Tissue Pressure Injury - Purple or maroon localized area of discolored intact skin or blood-filled blister due to damage of underlying soft tissue from pressure and/or shear.  Wound Description (Comments):   Present on Admission: Yes        DVT prophylaxis: Lovenox Code Status: Full Family Communication:  Disposition Plan: TBD   Consultants:    Procedures/Significant Events:  Chest x-ray 06/16/2019-bilateral infiltrates   I have personally reviewed and interpreted all radiology studies and my findings are as above.  VENTILATOR SETTINGS: Nasal cannula 12/29 Flow; 4 L/min SPO2; 100%    Cultures 12/28 HIV screen negative 12/28 MRSA by PCR negative 12/28 blood LEFT hand NGTD 12/28 blood RIGHT arm NGTD    Antimicrobials: Anti-infectives (From admission, onward)   Start     Dose/Rate Stop  06/17/19 1000  remdesivir 100 mg in sodium chloride 0.9 % 100 mL IVPB     100 mg 200 mL/hr over 30 Minutes 06/21/19 0959   06/17/19 0100   vancomycin (VANCOREADY) IVPB 750 mg/150 mL     750 mg 150 mL/hr over 60 Minutes     06/16/19 1930  ceFEPIme (MAXIPIME) 2 g in sodium chloride 0.9 % 100 mL IVPB     2 g 200 mL/hr over 30 Minutes     06/16/19 1230  remdesivir 200 mg in sodium chloride 0.9% 250 mL IVPB     200 mg 580 mL/hr over 30 Minutes 06/16/19 1639   06/16/19 1045  ceFEPIme (MAXIPIME) 2 g in sodium chloride 0.9 % 100 mL IVPB     2 g 200 mL/hr over 30 Minutes 06/16/19 1154   06/16/19 1045  metroNIDAZOLE (FLAGYL) IVPB 500 mg     500 mg 100 mL/hr over 60 Minutes 06/16/19 1321   06/16/19 1045  vancomycin (VANCOCIN) IVPB 1000 mg/200 mL premix  Status:  Discontinued     1,000 mg 200 mL/hr over 60 Minutes 06/16/19 1044   06/16/19 1045  vancomycin (VANCOREADY) IVPB 2000 mg/400 mL     2,000 mg 200 mL/hr over 120 Minutes 06/16/19 1900       Devices    LINES / TUBES:      Continuous Infusions: . sodium chloride 75 mL/hr at 06/16/19 1352  . ceFEPime (MAXIPIME) IV 2 g (06/17/19 1040)  . dextrose 5 % and 0.45% NaCl Stopped (06/17/19 0204)  . feeding supplement (OSMOLITE 1.5 CAL) 1,000 mL (06/17/19 1259)  . remdesivir 100 mg in NS 100 mL Stopped (06/17/19 1644)  . vancomycin 150 mL/hr at 06/17/19 1200     Objective: Vitals:   06/17/19 1200 06/17/19 1300 06/17/19 1530 06/17/19 1623  BP: 123/76 106/68 118/76 124/68  Pulse: 78 66 73 69  Resp: (!) 24 (!) 25 (!) 23 19  Temp:   98.6 F (37 C) 97.9 F (36.6 C)  TempSrc:   Oral Axillary  SpO2: 98% 97% 96% 97%  Weight:      Height:        Intake/Output Summary (Last 24 hours) at 06/17/2019 1712 Last data filed at 06/17/2019 1259 Gross per 24 hour  Intake 2753.52 ml  Output 250 ml  Net 2503.52 ml   Filed Weights   06/16/19 1053 06/17/19 0359  Weight: 77.1 kg 80.6 kg    Examination:  General: Eyes open tracks you around room withdraws to painful stimuli.  Does not follow commands positive acute respiratory distress Eyes: negative scleral hemorrhage,  negative anisocoria, negative icterus ENT: Negative Runny nose, negative gingival bleeding, Neck:  Negative scars, masses, torticollis, lymphadenopathy, JVD Lungs: Clear to auscultation bilaterally without wheezes or crackles Cardiovascular: Regular rate and rhythm without murmur gallop or rub normal S1 and S2 Abdomen: negative abdominal pain, nondistended, positive soft, bowel sounds, no rebound, no ascites, no appreciable mass Extremities: No significant cyanosis, clubbing, or edema bilateral lower extremities Skin: Multiple decubitus ulcers Psychiatric: Unable to assess Central nervous system: Patient spontaneously moves right arm, withdraws left arm.  Bilateral lower extremities are stuck in fetal position. .     Data Reviewed: Care during the described time interval was provided by me .  I have reviewed this patient's available data, including medical history, events of note, physical examination, and all test results as part of my evaluation.   CBC: Recent Labs  Lab 06/16/19 1049 06/16/19 1140 06/17/19 9767  WBC 6.2  --  7.5  NEUTROABS 5.2  --  6.5  HGB 16.2 15.6 14.1  HCT 54.7* 46.0 45.6  MCV 84.2  --  82.0  PLT 199  --  194   Basic Metabolic Panel: Recent Labs  Lab 06/16/19 1049 06/16/19 1140 06/17/19 0637  NA 147* 148* 152*  K 4.7 4.4 4.8  CL 113*  --  120*  CO2 22  --  22  GLUCOSE 473*  --  173*  BUN 37*  --  28*  CREATININE 1.16  --  0.87  CALCIUM 8.9  --  8.4*  MG  --   --  2.2  PHOS  --   --  2.6   GFR: Estimated Creatinine Clearance: 96.6 mL/min (by C-G formula based on SCr of 0.87 mg/dL). Liver Function Tests: Recent Labs  Lab 06/16/19 1049 06/17/19 0637  AST 118* 90*  ALT 65* 51*  ALKPHOS 71 58  BILITOT 0.3 0.7  PROT 7.2 6.2*  ALBUMIN 3.2* 2.8*   No results for input(s): LIPASE, AMYLASE in the last 168 hours. No results for input(s): AMMONIA in the last 168 hours. Coagulation Profile: Recent Labs  Lab 06/16/19 1049  INR 1.0   Cardiac  Enzymes: No results for input(s): CKTOTAL, CKMB, CKMBINDEX, TROPONINI in the last 168 hours. BNP (last 3 results) No results for input(s): PROBNP in the last 8760 hours. HbA1C: Recent Labs    06/16/19 1323  HGBA1C 5.9*   CBG: Recent Labs  Lab 06/17/19 0234 06/17/19 0450 06/17/19 0738 06/17/19 1116 06/17/19 1626  GLUCAP 171* 161* 159* 138* 204*   Lipid Profile: Recent Labs    06/16/19 1321  TRIG 137   Thyroid Function Tests: No results for input(s): TSH, T4TOTAL, FREET4, T3FREE, THYROIDAB in the last 72 hours. Anemia Panel: Recent Labs    06/16/19 1320 06/17/19 0637  FERRITIN 334 292   Urine analysis: No results found for: COLORURINE, APPEARANCEUR, LABSPEC, PHURINE, GLUCOSEU, HGBUR, BILIRUBINUR, KETONESUR, PROTEINUR, UROBILINOGEN, NITRITE, LEUKOCYTESUR Sepsis Labs: (procalcitonin:4,lacticidven:4)  ) Recent Results (from the past 240 hour(s))  Blood Culture (routine x 2)     Status: None (Preliminary result)   Collection Time: 06/16/19 10:52 AM   Specimen: BLOOD LEFT HAND  Result Value Ref Range Status   Specimen Description BLOOD LEFT HAND  Final   Special Requests   Final    BOTTLES DRAWN AEROBIC AND ANAEROBIC Blood Culture adequate volume   Culture   Final    NO GROWTH 1 DAY Performed at [facility], [address] [place], [place] [address]    Report Status PENDING  Incomplete  Blood Culture (routine x 2)     Status: None (Preliminary result)   Collection Time: 06/16/19 11:10 AM   Specimen: BLOOD RIGHT ARM  Result Value Ref Range Status   Specimen Description BLOOD RIGHT ARM  Final   Special Requests   Final    BOTTLES DRAWN AEROBIC AND ANAEROBIC Blood Culture adequate volume   Culture   Final    NO GROWTH 1 DAY Performed at [facility], [address] [place], [place] [address]    Report Status PENDING  Incomplete  MRSA PCR Screening     Status: None   Collection Time: 06/16/19  9:25 PM   Specimen: Nasal Mucosa;  Nasopharyngeal  Result Value Ref Range Status   MRSA by PCR NEGATIVE NEGATIVE Final    Comment:        The GeneXpert MRSA Assay (FDA  approved for NASAL specimens only), is one component of a comprehensive MRSA colonization surveillance program. It is not intended to diagnose MRSA infection nor to guide or monitor treatment for MRSA infections. Performed at [facility], [address] [place], [place] [address]          Radiology Studies: DG Chest Port 1 View  Result Date: 06/16/2019 CLINICAL DATA:  History of COVID, altered mental status, COVID positive EXAM: PORTABLE CHEST 1 VIEW COMPARISON:  No pertinent prior studies available for comparison. FINDINGS: Limited evaluation due to artifacts overlying the right hemithorax. Additionally, the patient's chin obscures the right lung apex and the patient is significantly rotated to the right. Heart size within normal limits. Ill-defined airspace opacities throughout both lungs. No evidence of pneumothorax within described limitations. No sizable pleural effusion. No acute bony abnormality. IMPRESSION: Limited examination as described. Ill-defined airspace opacities throughout both lungs consistent with pneumonia given provided history. Electronically Signed   By: Jackey LogeKyle  Golden DO   On: 06/16/2019 11:12        Scheduled Meds: . vitamin C  500 mg Per Tube Daily  . aspirin EC  81 mg Oral Daily  . chlorhexidine  15 mL Mouth Rinse BID  . Chlorhexidine Gluconate Cloth  6 each Topical q morning - 10a  . dexamethasone (DECADRON) injection  6 mg Intravenous Q24H  . enoxaparin (LOVENOX) injection  40 mg Subcutaneous Q24H  . feeding supplement (PRO-STAT SUGAR FREE 64)  30 mL Per Tube Daily  . free water  200 mL Per Tube Q8H  . insulin aspart  0-15 Units Subcutaneous Q4H  . insulin detemir  10 Units Subcutaneous Q24H  . Ipratropium-Albuterol  1 puff Inhalation QID  . mouth rinse  15 mL Mouth Rinse BID  . mouth rinse  15  mL Mouth Rinse q12n4p  . pantoprazole (PROTONIX) IV  40 mg Intravenous QHS  . zinc sulfate  220 mg Per Tube Daily   Continuous Infusions: . sodium chloride 75 mL/hr at 06/16/19 1352  . ceFEPime (MAXIPIME) IV 2 g (06/17/19 1040)  . dextrose 5 % and 0.45% NaCl Stopped (06/17/19 0204)  . feeding supplement (OSMOLITE 1.5 CAL) 1,000 mL (06/17/19 1259)  . remdesivir 100 mg in NS 100 mL Stopped (06/17/19 1644)  . vancomycin 150 mL/hr at 06/17/19 1200     LOS: 1 day   The patient is critically ill with multiple organ systems failure and requires high complexity decision making for assessment and support, frequent evaluation and titration of therapies, application of advanced monitoring technologies and extensive interpretation of multiple databases. Critical Care Time devoted to patient care services described in this note  Time spent: 40 minutes     Jex Strausbaugh, Roselind MessierURTIS J, MD [facility] Pager [phone]  If 7PM-7AM, please contact night-coverage [email or web address] Password [email or web address] 06/17/2019, 5:12 PM

## 2019-06-17 NOTE — Progress Notes (Signed)
PHARMACY - PHYSICIAN COMMUNICATION CRITICAL VALUE ALERT - BLOOD CULTURE IDENTIFICATION (BCID)  Lab communicated that there are gram positive cocci growing in 1/4 (aerobic bottle only) bottles. This likely represents a contaminant. Patient is on vancomycin and cefepime. Therapy is appropriate for now as cultures continue to grow.   Name of physician (or Provider) Contacted: Dr. Sherral Hammers  Changes to prescribed antibiotics required: none at this time.   Thank you,   Eddie Candle, PharmD PGY-1 Pharmacy Resident   Please check amion for clinical pharmacist contact number  06/17/2019  5:26 PM

## 2019-06-17 NOTE — Consult Note (Addendum)
WOC Nurse Consult Note: Reason for Consult:Deep tissue injury to right knee.  Deep tissue injury to coccyx. Left knee with stage 2 partial thickness injury  Patient is paraplegic and has been proned at times Wound type:pressure Pressure Injury POA: Yes to coccyx No to knees Measurement: Coccyx:  2 cm x 2 cm maroon discoloration, intact skin Left knee:  2 cm x 1 cm x 0.1 cm  Wound HYI:FOYDXA discoloration  Left knee is pink and moist Drainage (amount, consistency, odor) minimal serosanguinous  No odor Periwound:intact Dressing procedure/placement/frequency: Cleanse wounds to bilateral knees and sacrum with NS and pat dry.  Apply alginate to wound bed and cover with silicone foam.  Change daily. Kellie Simmering # [phone]) Will not follow at this time.  Please re-consult if needed.  Domenic Moras MSN, RN, FNP-BC CWON Wound, Ostomy, Continence Nurse Pager [phone]

## 2019-06-17 NOTE — Plan of Care (Signed)
Pt on 6LNC, sats 90 to 94%.  RR 20s to low 30s.  Pt restless when moved or touched otherwise, resting quietly in bed.  Monitor showed SR with rare PVC, rates 70 to 90s.  Pt has had a temp during shift - treated with Tylenol via tube.  See MAR.

## 2019-06-17 NOTE — Progress Notes (Signed)
Initial Nutrition Assessment  RD working remotely.  DOCUMENTATION CODES:   Not applicable  INTERVENTION:  Initiate Osmolite 1.5 Cal at 60 mL/hr + Pro-Stat 30 mL once daily per tube. Provides 2260 kcal, 105 grams of protein, 1094 mL H2O daily.  NUTRITION DIAGNOSIS:   Increased nutrient needs related to catabolic YOVZCHY(IFOYD-74) as evidenced by estimated needs.  GOAL:   Patient will meet greater than or equal to 90% of their needs  MONITOR:   Labs, Weight trends, TF tolerance, Skin, I & O's  REASON FOR ASSESSMENT:   Consult Enteral/tube feeding initiation and management, Assessment of nutrition requirement/status  ASSESSMENT:   63 year old male with PMHx of COPD, quadriplegia secondary to intracranial hemorrhage, dysphagia s/p placement of G-J tube, DM, HTN admitted from [facility] with severe COVID-19 PNA.   According to chart patient is nonverbal with quadriplegia. He is admitted from [facility]. Noted hx of dysphagia in chart and that patient has a PEG-jejunostomy tube. Cannot find any notes in chart on when this tube was placed. He may have initially had a PEG that was later converted to the PEG-jejunostomy but it is difficult to tell. Attempted to call wife's number listed in chart to verify nutrition/weight history but got a recording that the wireless caller was unavailable. According to home medications patient is on Isosource 1.5 Cal at 80 mL/hr x 14 hours from 1800-0800. This is a fiber-free formula and provides 1680 kcal, 76 grams of protein, 851 mL H2O daily. It is listed that patient gets a free water flush of 50 mL but does not list how often. While patient is acutely ill will provide tube feeds over 24 hours. He can transition back to nocturnal feeds when more stable.  There is no weight history in chart to trend.  Medications reviewed and include: vitamin C 500 mg daily per tube, Decadron 6 mg Q24 hrs IV, free water flush 200 mL Q8hrs, Novolog 0-15 units Q4hrs,  Levemir 10 units daily, pantoprazole, zinc sulfate 220 mg daily, cefepime, D5-1/2NS at 50 mL/hr, remdesivir, vancomycin.  Labs reviewed: CBG 138-338, Sodium 152, BUN 28.  Enteral Access: according to documentation in chart patient has a PEG-jejunostomy tube present on admission  Unable to determine if patient meets criteria for malnutrition at this time.  NUTRITION - FOCUSED PHYSICAL EXAM:  Unable to complete at this time.  Diet Order:   Diet Order            Diet NPO time specified  Diet effective now             EDUCATION NEEDS:   No education needs have been identified at this time  Skin:  Skin Assessment: Skin Integrity Issues: Skin Integrity Issues:: Stage II, DTI DTI: right knee Stage II: left knee  Last BM:  06/17/2019 - small type 6  Height:   Ht Readings from Last 1 Encounters:  06/16/19 6' (1.829 m)   Weight:   Wt Readings from Last 1 Encounters:  06/17/19 80.6 kg   Ideal Body Weight:  68.7 kg(adjusted for quadriplegia)  BMI:  Body mass index is 24.1 kg/m.  Estimated Nutritional Needs:   Kcal:  2050-2300  Protein:  103-120 grams  Fluid:  2 L/day  Jacklynn Barnacle, MS, RD, LDN Office: [phone] Pager: [phone] After Hours/Weekend Pager: [phone]

## 2019-06-18 DIAGNOSIS — L89002 Pressure ulcer of unspecified elbow, stage 2: Secondary | ICD-10-CM

## 2019-06-18 LAB — COMPREHENSIVE METABOLIC PANEL
ALT: 45 U/L — ABNORMAL HIGH (ref 0–44)
AST: 62 U/L — ABNORMAL HIGH (ref 15–41)
Albumin: 2.9 g/dL — ABNORMAL LOW (ref 3.5–5.0)
Alkaline Phosphatase: 65 U/L (ref 38–126)
Anion gap: 9 (ref 5–15)
BUN: 32 mg/dL — ABNORMAL HIGH (ref 8–23)
CO2: 19 mmol/L — ABNORMAL LOW (ref 22–32)
Calcium: 8.4 mg/dL — ABNORMAL LOW (ref 8.9–10.3)
Chloride: 122 mmol/L — ABNORMAL HIGH (ref 98–111)
Creatinine, Ser: 0.74 mg/dL (ref 0.61–1.24)
GFR calc Af Amer: >60 mL/min (ref >60)
GFR calc non Af Amer: >60 mL/min (ref >60)
Glucose, Bld: 396 mg/dL — ABNORMAL HIGH (ref 70–99)
Potassium: 4.2 mmol/L (ref 3.5–5.1)
Sodium: 150 mmol/L — ABNORMAL HIGH (ref 135–145)
Total Bilirubin: 0.7 mg/dL (ref 0.3–1.2)
Total Protein: 6.4 g/dL — ABNORMAL LOW (ref 6.5–8.1)

## 2019-06-18 LAB — CBC WITH DIFFERENTIAL/PLATELET
Abs Immature Granulocytes: 0.05 10*3/uL (ref 0.00–0.07)
Basophils Absolute: 0 10*3/uL (ref 0.0–0.1)
Basophils Relative: 0 %
Eosinophils Absolute: 0 10*3/uL (ref 0.0–0.5)
Eosinophils Relative: 0 %
HCT: 44 % (ref 39.0–52.0)
Hemoglobin: 13.5 g/dL (ref 13.0–17.0)
Immature Granulocytes: 1 %
Lymphocytes Relative: 6 %
Lymphs Abs: 0.5 10*3/uL — ABNORMAL LOW (ref 0.7–4.0)
MCH: 24.9 pg — ABNORMAL LOW (ref 26.0–34.0)
MCHC: 30.7 g/dL (ref 30.0–36.0)
MCV: 81 fL (ref 80.0–100.0)
Monocytes Absolute: 0.2 10*3/uL (ref 0.1–1.0)
Monocytes Relative: 3 %
Neutro Abs: 7.5 10*3/uL (ref 1.7–7.7)
Neutrophils Relative %: 90 %
Platelets: 225 10*3/uL (ref 150–400)
RBC: 5.43 MIL/uL (ref 4.22–5.81)
RDW: 17.2 % — ABNORMAL HIGH (ref 11.5–15.5)
WBC: 8.3 10*3/uL (ref 4.0–10.5)
nRBC: 0 % (ref 0.0–0.2)

## 2019-06-18 LAB — GLUCOSE, CAPILLARY
Glucose-Capillary: 330 mg/dL — ABNORMAL HIGH (ref 70–99)
Glucose-Capillary: 410 mg/dL — ABNORMAL HIGH (ref 70–99)
Glucose-Capillary: 435 mg/dL — ABNORMAL HIGH (ref 70–99)
Glucose-Capillary: 274 mg/dL — ABNORMAL HIGH (ref 70–99)

## 2019-06-18 LAB — FERRITIN: Ferritin: 495 ng/mL — ABNORMAL HIGH (ref 24–336)

## 2019-06-18 LAB — C-REACTIVE PROTEIN: CRP: 10.9 mg/dL — ABNORMAL HIGH (ref <1.0)

## 2019-06-18 LAB — PHOSPHORUS: Phosphorus: 1.1 mg/dL — ABNORMAL LOW (ref 2.5–4.6)

## 2019-06-18 LAB — MAGNESIUM: Magnesium: 2.4 mg/dL (ref 1.7–2.4)

## 2019-06-18 LAB — PROCALCITONIN: Procalcitonin: 0.83 ng/mL

## 2019-06-18 LAB — D-DIMER, QUANTITATIVE: D-Dimer, Quant: 1.2 ug/mL-FEU — ABNORMAL HIGH (ref 0.00–0.50)

## 2019-06-18 MED ORDER — PANTOPRAZOLE SODIUM 40 MG PO PACK
40.0000 mg | PACK | Freq: Every day | ORAL | Status: DC
  Administered 2019-06-18 – 2019-06-20 (×3): 40 mg
  Filled 2019-06-18 (×4): qty 20

## 2019-06-18 MED ORDER — ASPIRIN 81 MG PO CHEW
81.0000 mg | CHEWABLE_TABLET | Freq: Every day | ORAL | Status: DC
  Administered 2019-06-18 – 2019-06-21 (×4): 81 mg
  Filled 2019-06-18 (×4): qty 1

## 2019-06-18 MED ORDER — INSULIN ASPART 100 UNIT/ML ~~LOC~~ SOLN
10.0000 [IU] | Freq: Four times a day (QID) | SUBCUTANEOUS | Status: DC
  Administered 2019-06-18 – 2019-06-19 (×4): 10 [IU] via SUBCUTANEOUS

## 2019-06-18 MED ORDER — INSULIN ASPART 100 UNIT/ML ~~LOC~~ SOLN
0.0000 [IU] | SUBCUTANEOUS | Status: DC
  Administered 2019-06-18: 11 [IU] via SUBCUTANEOUS
  Administered 2019-06-18: 20 [IU] via SUBCUTANEOUS
  Administered 2019-06-18: 15 [IU] via SUBCUTANEOUS
  Administered 2019-06-18: 25 [IU] via SUBCUTANEOUS
  Administered 2019-06-19: 20 [IU] via SUBCUTANEOUS
  Administered 2019-06-19 – 2019-06-20 (×3): 11 [IU] via SUBCUTANEOUS
  Administered 2019-06-20: 7 [IU] via SUBCUTANEOUS
  Administered 2019-06-20 – 2019-06-21 (×4): 11 [IU] via SUBCUTANEOUS
  Administered 2019-06-21: 01:00:00 4 [IU] via SUBCUTANEOUS
  Administered 2019-06-21: 7 [IU] via SUBCUTANEOUS

## 2019-06-18 MED ORDER — FREE WATER
200.0000 mL | Freq: Four times a day (QID) | Status: DC
  Administered 2019-06-18 – 2019-06-20 (×8): 200 mL

## 2019-06-18 MED ORDER — SODIUM PHOSPHATES 45 MMOLE/15ML IV SOLN
30.0000 mmol | Freq: Once | INTRAVENOUS | Status: AC
  Administered 2019-06-18: 30 mmol via INTRAVENOUS
  Filled 2019-06-18: qty 10

## 2019-06-18 MED ORDER — INSULIN DETEMIR 100 UNIT/ML ~~LOC~~ SOLN
15.0000 [IU] | SUBCUTANEOUS | Status: DC
  Administered 2019-06-18 – 2019-06-19 (×2): 15 [IU] via SUBCUTANEOUS
  Filled 2019-06-18 (×3): qty 0.15

## 2019-06-18 NOTE — Progress Notes (Signed)
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  Spontaneously moves right arm.  Pulls left arm away when disturbed.   Assessment & Plan:   Active Problems:   Acute respiratory failure due to COVID-19 ([place])   Pressure injury of skin   Pneumonia due to COVID-19 virus   Diabetes mellitus type 2, controlled, with complications ([place])   Quadriplegia ([place])   History of intracranial hemorrhage   Essential hypertension   Acute respiratory failure with hypoxia ([facility])   Bacterial pneumonia   Hypernatremia   Pressure ulcer   Covid pneumonia/acute respiratory failure with hypoxia COVID-19 Labs  Recent Labs    06/16/19 1320 06/17/19 0637 06/18/19 0342  DDIMER 4.02* 1.51* 1.20*  FERRITIN 334 292 495*  LDH 744*  --   --   CRP 8.9* 13.9* 10.9*    12/21 Covid coronavirus positive at [facility] -12/28 Barcitinib--> 12/29 DC'd -Remdesivir per pharmacy protocol -Decadron 6 mg daily -Combivent QID -Vitamins per Covid protocol -Titrate O2 to maintain SPO2> 88% -Flutter valve -Incentive spirometer  Bacterial pneumonia superinfection -Trend procalcitonin; slightly elevated, with lactic acidosis and fever will continue cefepime x5 days. -DC vancomycin -Trend procalcitonin Results for Billy Nelson, Billy Nelson (MRN [id number]) as of 06/18/2019 15:12  Ref. Range 06/16/2019 13:20 06/17/2019 06:37 06/18/2019 03:42  Procalcitonin Latest Units: ng/mL 0.38 0.54 0.83    Hx intracranial hemorrhage/quadriplegia -Quadriplegic, nonverbal.  Does  withdraw to painful stimuli, tracks with his eyes  Diabetes type 2 controlled with complication -78/24 hemoglobin A1c= 5.9 -12/30Levemir 15 units daily -12/30 NovoLog 10 units QID -12/30 resistant SSI   Essential HTN -Currently controlled without medication monitor closely  Hypernatremia Recent Labs  Lab 06/16/19 1049 06/16/19 1140 06/17/19 0637 06/18/19 0342  NA 147* 148* 152* 150*  -12/29 increase free water 200 ml QID   Hypophosphatemia -Sodium phosphate IV 30 mmol   Pressure ulcer Pressure Injury 06/16/19 Coccyx Healed (Active)  06/16/19 1649  Location: Coccyx  Location Orientation:   Staging:   Wound Description (Comments): Healed  Present on Admission: Yes     Pressure Injury 06/16/19 Knee Anterior;Left;Medial Stage 2 -  Partial thickness loss of dermis presenting as a shallow open injury with a red, pink wound bed without slough. (Active)  06/16/19 1651  Location: Knee  Location Orientation: Anterior;Left;Medial  Staging: Stage 2 -  Partial thickness loss of dermis presenting as a shallow open injury with a red, pink wound bed without slough.  Wound Description (Comments):   Present on Admission: Yes     Pressure Injury 06/16/19 Knee Anterior;Right;Medial Deep Tissue Pressure Injury - Purple or maroon localized area of discolored intact skin or blood-filled blister due to damage of underlying soft tissue from pressure and/or shear. (Active)  06/16/19 1759  Location: Knee  Location Orientation: Anterior;Right;Medial  Staging: Deep Tissue Pressure Injury - Purple or maroon localized area of discolored intact skin or blood-filled blister due to damage of underlying soft tissue from pressure and/or shear.  Wound Description (Comments):   Present on Admission: Yes        DVT prophylaxis: Lovenox Code Status: Full Family  Communication:  Disposition Plan: TBD   Consultants:    Procedures/Significant Events:  Chest x-ray 06/16/2019-bilateral  infiltrates   I have personally reviewed and interpreted all radiology studies and my findings are as above.  VENTILATOR SETTINGS: Room air 12/30 SPO2; 92%    Cultures 12/28 HIV screen negative 12/28 MRSA by PCR negative 12/28 blood LEFT hand NGTD 12/28 blood RIGHT arm NGTD    Antimicrobials: Anti-infectives (From admission, onward)   Start     Dose/Rate Stop   06/17/19 1000  remdesivir 100 mg in sodium chloride 0.9 % 100 mL IVPB     100 mg 200 mL/hr over 30 Minutes 06/21/19 0959   06/17/19 0100  vancomycin (VANCOREADY) IVPB 750 mg/150 mL  Status:  Discontinued     750 mg 150 mL/hr over 60 Minutes 06/18/19 1001   06/16/19 1930  ceFEPIme (MAXIPIME) 2 g in sodium chloride 0.9 % 100 mL IVPB     2 g 200 mL/hr over 30 Minutes     06/16/19 1230  remdesivir 200 mg in sodium chloride 0.9% 250 mL IVPB     200 mg 580 mL/hr over 30 Minutes 06/16/19 1639   06/16/19 1045  ceFEPIme (MAXIPIME) 2 g in sodium chloride 0.9 % 100 mL IVPB     2 g 200 mL/hr over 30 Minutes 06/16/19 1154   06/16/19 1045  metroNIDAZOLE (FLAGYL) IVPB 500 mg     500 mg 100 mL/hr over 60 Minutes 06/16/19 1321   06/16/19 1045  vancomycin (VANCOCIN) IVPB 1000 mg/200 mL premix  Status:  Discontinued     1,000 mg 200 mL/hr over 60 Minutes 06/16/19 1044   06/16/19 1045  vancomycin (VANCOREADY) IVPB 2000 mg/400 mL     2,000 mg 200 mL/hr over 120 Minutes 06/16/19 1900       Devices    LINES / TUBES:      Continuous Infusions: . sodium chloride 75 mL/hr at 06/16/19 1352  . ceFEPime (MAXIPIME) IV 2 g (06/18/19 0205)  . dextrose 5 % and 0.45% NaCl Stopped (06/17/19 0204)  . feeding supplement (OSMOLITE 1.5 CAL) 1,000 mL (06/17/19 1259)  . remdesivir 100 mg in NS 100 mL Stopped (06/17/19 1644)  . vancomycin 750 mg (06/17/19 2355)     Objective: Vitals:   06/17/19 1623 06/17/19 1952 06/17/19 2357 06/18/19 0320  BP: 124/68 106/72 120/72 114/67  Pulse: 69 79 82 86  Resp: 19 20 (!) 21 (!) 24  Temp:  97.9 F (36.6 C) (!) 97.5 F (36.4 C) (!) 97.5 F (36.4 C) 98 F (36.7 C)  TempSrc: Axillary Axillary Axillary Axillary  SpO2: 97% 96% 95% 94%  Weight:      Height:        Intake/Output Summary (Last 24 hours) at 06/18/2019 8469 Last data filed at 06/18/2019 0600 Gross per 24 hour  Intake 378.75 ml  Output 900 ml  Net -521.25 ml   Filed Weights   06/16/19 1053 06/17/19 0359  Weight: 77.1 kg 80.6 kg   Physical Exam:  General: Eyes open tracks around room, withdraws to painful stimuli, no acute respiratory distress Eyes: negative scleral hemorrhage, negative anisocoria, negative icterus ENT: Negative Runny nose, negative gingival bleeding, Neck:  Negative scars, masses, torticollis, lymphadenopathy, JVD Lungs: Clear to auscultation bilaterally without wheezes or crackles Cardiovascular: Regular rate and rhythm without murmur gallop or rub normal S1 and S2 Abdomen: negative abdominal pain, nondistended, positive soft, bowel sounds, no rebound, no ascites, no appreciable mass Extremities: No significant cyanosis, clubbing, or  edema bilateral lower extremities Skin: Negative rashes, lesions, ulcers Psychiatric: Unable to evaluate secondary to altered mental status Central nervous system: Spontaneously moves RIGHT arm withdrawals left arm to painful stimuli.  Bilateral lower extremities are stuck in fetal position    Data Reviewed: Care during the described time interval was provided by me .  I have reviewed this patient's available data, including medical history, events of note, physical examination, and all test results as part of my evaluation.   CBC: Recent Labs  Lab 06/16/19 1049 06/16/19 1140 06/17/19 0637 06/18/19 0342  WBC 6.2  --  7.5 8.3  NEUTROABS 5.2  --  6.5 7.5  HGB 16.2 15.6 14.1 13.5  HCT 54.7* 46.0 45.6 44.0  MCV 84.2  --  82.0 81.0  PLT 199  --  194 225   Basic Metabolic Panel: Recent Labs  Lab 06/16/19 1049 06/16/19 1140 06/17/19 0637  06/18/19 0342  NA 147* 148* 152* 150*  K 4.7 4.4 4.8 4.2  CL 113*  --  120* 122*  CO2 22  --  22 19*  GLUCOSE 473*  --  173* 396*  BUN 37*  --  28* 32*  CREATININE 1.16  --  0.87 0.74  CALCIUM 8.9  --  8.4* 8.4*  MG  --   --  2.2 2.4  PHOS  --   --  2.6 1.1*   GFR: Estimated Creatinine Clearance: 105.1 mL/min (by C-G formula based on SCr of 0.74 mg/dL). Liver Function Tests: Recent Labs  Lab 06/16/19 1049 06/17/19 0637 06/18/19 0342  AST 118* 90* 62*  ALT 65* 51* 45*  ALKPHOS 71 58 65  BILITOT 0.3 0.7 0.7  PROT 7.2 6.2* 6.4*  ALBUMIN 3.2* 2.8* 2.9*   No results for input(s): LIPASE, AMYLASE in the last 168 hours. No results for input(s): AMMONIA in the last 168 hours. Coagulation Profile: Recent Labs  Lab 06/16/19 1049  INR 1.0   Cardiac Enzymes: No results for input(s): CKTOTAL, CKMB, CKMBINDEX, TROPONINI in the last 168 hours. BNP (last 3 results) No results for input(s): PROBNP in the last 8760 hours. HbA1C: Recent Labs    06/16/19 1323  HGBA1C 5.9*   CBG: Recent Labs  Lab 06/17/19 1116 06/17/19 1626 06/17/19 1920 06/17/19 2346 06/18/19 0311  GLUCAP 138* 204* 230* 309* 330*   Lipid Profile: Recent Labs    06/16/19 1321  TRIG 137   Thyroid Function Tests: No results for input(s): TSH, T4TOTAL, FREET4, T3FREE, THYROIDAB in the last 72 hours. Anemia Panel: Recent Labs    06/17/19 0637 06/18/19 0342  FERRITIN 292 495*   Urine analysis: No results found for: COLORURINE, APPEARANCEUR, LABSPEC, PHURINE, GLUCOSEU, HGBUR, BILIRUBINUR, KETONESUR, PROTEINUR, UROBILINOGEN, NITRITE, LEUKOCYTESUR Sepsis Labs: @LABRCNTIP (procalcitonin:4,lacticidven:4)  ) Recent Results (from the past 240 hour(s))  Blood Culture (routine x 2)     Status: None (Preliminary result)   Collection Time: 06/16/19 10:52 AM   Specimen: BLOOD LEFT HAND  Result Value Ref Range Status   Specimen Description BLOOD LEFT HAND  Final   Special Requests   Final    BOTTLES DRAWN  AEROBIC AND ANAEROBIC Blood Culture adequate volume   Culture  Setup Time   Final    IN BOTH AEROBIC AND ANAEROBIC BOTTLES GRAM POSITIVE COCCI CRITICAL RESULT CALLED TO, READ BACK BY AND VERIFIED WITH: Albertine GratesNDREA L [facility] Hospital[facility]ARMD 06/17/19 1720 JDW Performed at [facility], [address] [place], [place] [address]    Culture PENDING  Incomplete   Report  Status PENDING  Incomplete  Blood Culture (routine x 2)     Status: None (Preliminary result)   Collection Time: 06/16/19 11:10 AM   Specimen: BLOOD RIGHT ARM  Result Value Ref Range Status   Specimen Description BLOOD RIGHT ARM  Final   Special Requests   Final    BOTTLES DRAWN AEROBIC AND ANAEROBIC Blood Culture adequate volume   Culture   Final    NO GROWTH 1 DAY Performed at [facility], [address] [place], [place] [address]    Report Status PENDING  Incomplete  MRSA PCR Screening     Status: None   Collection Time: 06/16/19  9:25 PM   Specimen: Nasal Mucosa; Nasopharyngeal  Result Value Ref Range Status   MRSA by PCR NEGATIVE NEGATIVE Final    Comment:        The GeneXpert MRSA Assay (FDA approved for NASAL specimens only), is one component of a comprehensive MRSA colonization surveillance program. It is not intended to diagnose MRSA infection nor to guide or monitor treatment for MRSA infections. Performed at [facility], [address], [place], [place] [address]          Radiology Studies: DG Chest Port 1 View  Result Date: 06/16/2019 CLINICAL DATA:  History of COVID, altered mental status, COVID positive EXAM: PORTABLE CHEST 1 VIEW COMPARISON:  No pertinent prior studies available for comparison. FINDINGS: Limited evaluation due to artifacts overlying the right hemithorax. Additionally, the patient's chin obscures the right lung apex and the patient is significantly rotated to the right. Heart size within normal limits. Ill-defined airspace opacities throughout both lungs. No evidence  of pneumothorax within described limitations. No sizable pleural effusion. No acute bony abnormality. IMPRESSION: Limited examination as described. Ill-defined airspace opacities throughout both lungs consistent with pneumonia given provided history. Electronically Signed   By: Jackey Loge DO   On: 06/16/2019 11:12        Scheduled Meds: . vitamin C  500 mg Per Tube Daily  . aspirin EC  81 mg Oral Daily  . chlorhexidine  15 mL Mouth Rinse BID  . Chlorhexidine Gluconate Cloth  6 each Topical q morning - 10a  . dexamethasone (DECADRON) injection  6 mg Intravenous Q24H  . enoxaparin (LOVENOX) injection  40 mg Subcutaneous Q24H  . feeding supplement (PRO-STAT SUGAR FREE 64)  30 mL Per Tube Daily  . free water  200 mL Per Tube Q8H  . insulin aspart  0-15 Units Subcutaneous Q4H  . insulin detemir  10 Units Subcutaneous Q24H  . Ipratropium-Albuterol  1 puff Inhalation QID  . mouth rinse  15 mL Mouth Rinse BID  . mouth rinse  15 mL Mouth Rinse q12n4p  . pantoprazole (PROTONIX) IV  40 mg Intravenous QHS  . zinc sulfate  220 mg Per Tube Daily   Continuous Infusions: . sodium chloride 75 mL/hr at 06/16/19 1352  . ceFEPime (MAXIPIME) IV 2 g (06/18/19 0205)  . dextrose 5 % and 0.45% NaCl Stopped (06/17/19 0204)  . feeding supplement (OSMOLITE 1.5 CAL) 1,000 mL (06/17/19 1259)  . remdesivir 100 mg in NS 100 mL Stopped (06/17/19 1644)  . vancomycin 750 mg (06/17/19 2355)     LOS: 2 days   The patient is critically ill with multiple organ systems failure and requires high complexity decision making for assessment and support, frequent evaluation and titration of therapies, application of advanced monitoring technologies and extensive interpretation of multiple databases. Critical Care Time devoted  to patient care services described in this note  Time spent: 40 minutes     Alexsandro Salek, Roselind Messier, MD [facility] Pager [phone]  If 7PM-7AM, please contact  night-coverage [email or web address] Password [email or web address] 06/18/2019, 7:52 AM

## 2019-06-18 NOTE — Progress Notes (Signed)
Inpatient Diabetes Program Recommendations  AACE/ADA: New Consensus Statement on Inpatient Glycemic Control (2015)  Target Ranges:  Prepandial:   less than 140 mg/dL      Peak postprandial:   less than 180 mg/dL (1-2 hours)      Critically ill patients:  140 - 180 mg/dL   Lab Results  Component Value Date   GLUCAP 410 (H) 06/18/2019   HGBA1C 5.9 (H) 06/16/2019    Review of Glycemic Control Results for BUELL, PARCEL (MRN [id number]) as of 06/18/2019 09:57  Ref. Range 06/17/2019 11:16 06/17/2019 16:26 06/17/2019 19:20 06/17/2019 23:46 06/18/2019 03:11 06/18/2019 07:49  Glucose-Capillary Latest Ref Range: 70 - 99 mg/dL 138 (H) 204 (H) 230 (H) 309 (H) 330 (H) 410 (H)   Outpatient Diabetes medications:  None Current orders for Inpatient glycemic control:  Decadron 6 mg IV daily Novolog Resistant q 4 hours Levemir 15 units daily Osmolite 60 ml/ hr Inpatient Diabetes Program Recommendations:    -Please increase Levemir to 15 units bid.  -Also please add Novolog 4 units q 4 hours to cover the CHO in tube feeds.  -If blood sugars continue greater than 300 mg/dL, consider IV insulin/EndoTool.   Thanks  Adah Perl, RN, BC-ADM Inpatient Diabetes Coordinator Pager [phone] (8a-5p)

## 2019-06-19 ENCOUNTER — Inpatient Hospital Stay (HOSPITAL_COMMUNITY): Payer: Medicare Other

## 2019-06-19 LAB — FERRITIN: Ferritin: 477 ng/mL — ABNORMAL HIGH (ref 24–336)

## 2019-06-19 LAB — CBC WITH DIFFERENTIAL/PLATELET
Abs Immature Granulocytes: 0.03 10*3/uL (ref 0.00–0.07)
Basophils Absolute: 0 10*3/uL (ref 0.0–0.1)
Basophils Relative: 0 %
Eosinophils Absolute: 0 10*3/uL (ref 0.0–0.5)
Eosinophils Relative: 0 %
HCT: 42.6 % (ref 39.0–52.0)
Hemoglobin: 13.4 g/dL (ref 13.0–17.0)
Immature Granulocytes: 0 %
Lymphocytes Relative: 7 %
Lymphs Abs: 0.5 10*3/uL — ABNORMAL LOW (ref 0.7–4.0)
MCH: 25.2 pg — ABNORMAL LOW (ref 26.0–34.0)
MCHC: 31.5 g/dL (ref 30.0–36.0)
MCV: 80.2 fL (ref 80.0–100.0)
Monocytes Absolute: 0.4 10*3/uL (ref 0.1–1.0)
Monocytes Relative: 5 %
Neutro Abs: 6.8 10*3/uL (ref 1.7–7.7)
Neutrophils Relative %: 88 %
Platelets: 252 10*3/uL (ref 150–400)
RBC: 5.31 MIL/uL (ref 4.22–5.81)
RDW: 16.8 % — ABNORMAL HIGH (ref 11.5–15.5)
WBC: 7.7 10*3/uL (ref 4.0–10.5)
nRBC: 0 % (ref 0.0–0.2)

## 2019-06-19 LAB — GLUCOSE, CAPILLARY
Glucose-Capillary: 273 mg/dL — ABNORMAL HIGH (ref 70–99)
Glucose-Capillary: 283 mg/dL — ABNORMAL HIGH (ref 70–99)
Glucose-Capillary: 306 mg/dL — ABNORMAL HIGH (ref 70–99)
Glucose-Capillary: 319 mg/dL — ABNORMAL HIGH (ref 70–99)
Glucose-Capillary: 319 mg/dL — ABNORMAL HIGH (ref 70–99)
Glucose-Capillary: 360 mg/dL — ABNORMAL HIGH (ref 70–99)
Glucose-Capillary: 375 mg/dL — ABNORMAL HIGH (ref 70–99)

## 2019-06-19 LAB — COMPREHENSIVE METABOLIC PANEL
ALT: 47 U/L — ABNORMAL HIGH (ref 0–44)
AST: 57 U/L — ABNORMAL HIGH (ref 15–41)
Albumin: 2.4 g/dL — ABNORMAL LOW (ref 3.5–5.0)
Alkaline Phosphatase: 90 U/L (ref 38–126)
Anion gap: 11 (ref 5–15)
BUN: 29 mg/dL — ABNORMAL HIGH (ref 8–23)
CO2: 20 mmol/L — ABNORMAL LOW (ref 22–32)
Calcium: 8.1 mg/dL — ABNORMAL LOW (ref 8.9–10.3)
Chloride: 120 mmol/L — ABNORMAL HIGH (ref 98–111)
Creatinine, Ser: 0.73 mg/dL (ref 0.61–1.24)
GFR calc Af Amer: >60 mL/min (ref >60)
GFR calc non Af Amer: >60 mL/min (ref >60)
Glucose, Bld: 411 mg/dL — ABNORMAL HIGH (ref 70–99)
Potassium: 4 mmol/L (ref 3.5–5.1)
Sodium: 151 mmol/L — ABNORMAL HIGH (ref 135–145)
Total Bilirubin: 0.5 mg/dL (ref 0.3–1.2)
Total Protein: 5.6 g/dL — ABNORMAL LOW (ref 6.5–8.1)

## 2019-06-19 LAB — CULTURE, BLOOD (ROUTINE X 2): Special Requests: ADEQUATE

## 2019-06-19 LAB — D-DIMER, QUANTITATIVE: D-Dimer, Quant: 1.16 ug/mL-FEU — ABNORMAL HIGH (ref 0.00–0.50)

## 2019-06-19 LAB — MAGNESIUM: Magnesium: 2.3 mg/dL (ref 1.7–2.4)

## 2019-06-19 LAB — C-REACTIVE PROTEIN: CRP: 4.6 mg/dL — ABNORMAL HIGH (ref <1.0)

## 2019-06-19 LAB — PHOSPHORUS: Phosphorus: 2.2 mg/dL — ABNORMAL LOW (ref 2.5–4.6)

## 2019-06-19 LAB — PROCALCITONIN: Procalcitonin: 0.83 ng/mL

## 2019-06-19 MED ORDER — INSULIN ASPART 100 UNIT/ML ~~LOC~~ SOLN
10.0000 [IU] | SUBCUTANEOUS | Status: DC
  Administered 2019-06-19 – 2019-06-20 (×4): 10 [IU] via SUBCUTANEOUS

## 2019-06-19 NOTE — Progress Notes (Signed)
  Called to patient bedsideby bedside RN.  Patient in no apparent respiratory distress, some tachycardia.  Patient currently on 6l .  Patient with coarse breath sounds throughout; attempted to NTS for small amount of secretions.  Patient tolerated well.

## 2019-06-19 NOTE — Progress Notes (Signed)
Inpatient Diabetes Program Recommendations  AACE/ADA: New Consensus Statement on Inpatient Glycemic Control (2015)  Target Ranges:  Prepandial:   less than 140 mg/dL      Peak postprandial:   less than 180 mg/dL (1-2 hours)      Critically ill patients:  140 - 180 mg/dL   Lab Results  Component Value Date   GLUCAP 273 (H) 06/19/2019   HGBA1C 5.9 (H) 06/16/2019    Review of Glycemic Control Results for Billy Nelson, Billy Nelson (MRN [id number]) as of 06/19/2019 11:50  Ref. Range 06/18/2019 23:52 06/19/2019 03:22 06/19/2019 07:51 06/19/2019 11:12  Glucose-Capillary Latest Ref Range: 70 - 99 mg/dL 360 (H) 375 (H) 306 (H) 273 (H)   Outpatient Diabetes medications:  None Current orders for Inpatient glycemic control:  Decadron 6 mg IV daily Novolog Resistant q 4 hours Levemir 15 units daily Novolog 10 units QID Osmolite 60 ml/ hr  Inpatient Diabetes Program Recommendations:    -Increase Levemir to 15 units bid.   Thanks, Bronson Curb, MSN, RNC-OB Diabetes Coordinator [phone] (8a-5p)

## 2019-06-19 NOTE — Progress Notes (Signed)
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  Eyes open tracks you around the room.  Spontaneously moves right arm    Assessment & Plan:   Active Problems:   Acute respiratory failure due to COVID-19 (HCC)   Pressure injury of skin   Pneumonia due to COVID-19 virus   Diabetes mellitus type 2, controlled, with complications (HCC)   Quadriplegia (HCC)   History of intracranial hemorrhage   Essential hypertension   Acute respiratory failure with hypoxia (HCC)   Bacterial pneumonia   Hypernatremia   Pressure ulcer   Covid pneumonia/acute respiratory failure with hypoxia COVID-19 Labs  Recent Labs    06/16/19 1320 06/17/19 0637 06/18/19 0342 06/19/19 0310  DDIMER 4.02* 1.51* 1.20* 1.16*  FERRITIN 334 292 495* 477*  LDH 744*  --   --   --   CRP 8.9* 13.9* 10.9* 4.6*    12/21 Covid coronavirus positive at [facility] -12/28 Barcitinib--> 12/29 DC'd -Remdesivir per pharmacy protocol -Decadron 6 mg daily -Combivent QID -Vitamins per Covid protocol -Titrate O2 to maintain SPO2> 88% -Flutter valve -Incentive spirometer  Bacterial pneumonia superinfection -Trend procalcitonin; slightly elevated, with lactic acidosis and fever will continue cefepime x5 days. -DC vancomycin -Trend procalcitonin Results for KOEHN, SALEHI (MRN [id number]) as of 06/19/2019 17:20  Ref. Range 06/16/2019 13:20 06/17/2019 06:37 06/18/2019 03:42 06/19/2019 03:10  Procalcitonin Latest Units: ng/mL 0.38 0.54 0.83 0.83    Hx intracranial hemorrhage/quadriplegia  -Quadriplegic, nonverbal.  Does withdraw to painful stimuli, tracks with his eyes  Diabetes type 2 controlled with complication -12/28 hemoglobin A1c= 5.9 -12/30Levemir 15 units daily -12/31 NovoLog 10 units q 4 hr -resistant SSI   Essential HTN -Currently controlled without medication monitor closely  Hypernatremia Recent Labs  Lab 06/16/19 1049 06/16/19 1140 06/17/19 0637 06/18/19 0342 06/19/19 0310  NA 147* 148* 152* 150* 151*  -12/29 increase free water 200 ml QID   Hypophosphatemia -Resolved  Pressure ulcer Pressure Injury 06/16/19 Coccyx Healed (Active)  06/16/19 1649  Location: Coccyx  Location Orientation:   Staging:   Wound Description (Comments): Healed  Present on Admission: Yes     Pressure Injury 06/16/19 Knee Anterior;Left;Medial Stage 2 -  Partial thickness loss of dermis presenting as a shallow open injury with a red, pink wound bed without slough. (Active)  06/16/19 1651  Location: Knee  Location Orientation: Anterior;Left;Medial  Staging: Stage 2 -  Partial thickness loss of dermis presenting as a shallow open injury with a red, pink wound bed without slough.  Wound Description (Comments):   Present on Admission: Yes     Pressure Injury 06/16/19 Knee Anterior;Right;Medial Deep Tissue Pressure Injury - Purple or maroon localized area of discolored intact skin or blood-filled blister due to damage of underlying soft tissue from pressure and/or shear. (Active)  06/16/19 1759  Location: Knee  Location Orientation: Anterior;Right;Medial  Staging: Deep Tissue Pressure Injury - Purple or maroon localized area of discolored intact skin or blood-filled blister due to damage of underlying soft tissue from pressure and/or shear.  Wound Description (Comments):   Present on Admission: Yes  DVT prophylaxis: Lovenox Code Status: Full Family Communication:  Disposition Plan: TBD   Consultants:    Procedures/Significant Events:  Chest x-ray  06/16/2019-bilateral infiltrates   I have personally reviewed and interpreted all radiology studies and my findings are as above.  VENTILATOR SETTINGS  Nasal cannula 12/31 Flow; 6 L/min SPO2; 94%   Cultures 12/28 HIV screen negative 12/28 MRSA by PCR negative 12/28 blood LEFT hand NGTD 12/28 blood RIGHT arm NGTD    Antimicrobials: Anti-infectives (From admission, onward)   Start     Dose/Rate Stop   06/17/19 1000  remdesivir 100 mg in sodium chloride 0.9 % 100 mL IVPB     100 mg 200 mL/hr over 30 Minutes 06/21/19 0959   06/17/19 0100  vancomycin (VANCOREADY) IVPB 750 mg/150 mL  Status:  Discontinued     750 mg 150 mL/hr over 60 Minutes 06/18/19 1001   06/16/19 1930  ceFEPIme (MAXIPIME) 2 g in sodium chloride 0.9 % 100 mL IVPB     2 g 200 mL/hr over 30 Minutes     06/16/19 1230  remdesivir 200 mg in sodium chloride 0.9% 250 mL IVPB     200 mg 580 mL/hr over 30 Minutes 06/16/19 1639   06/16/19 1045  ceFEPIme (MAXIPIME) 2 g in sodium chloride 0.9 % 100 mL IVPB     2 g 200 mL/hr over 30 Minutes 06/16/19 1154   06/16/19 1045  metroNIDAZOLE (FLAGYL) IVPB 500 mg     500 mg 100 mL/hr over 60 Minutes 06/16/19 1321   06/16/19 1045  vancomycin (VANCOCIN) IVPB 1000 mg/200 mL premix  Status:  Discontinued     1,000 mg 200 mL/hr over 60 Minutes 06/16/19 1044   06/16/19 1045  vancomycin (VANCOREADY) IVPB 2000 mg/400 mL     2,000 mg 200 mL/hr over 120 Minutes 06/16/19 1900       Devices    LINES / TUBES:      Continuous Infusions: . sodium chloride 75 mL/hr at 06/19/19 0108  . ceFEPime (MAXIPIME) IV 2 g (06/19/19 0325)  . dextrose 5 % and 0.45% NaCl Stopped (06/17/19 0204)  . feeding supplement (OSMOLITE 1.5 CAL) 1,000 mL (06/17/19 1259)  . remdesivir 100 mg in NS 100 mL 100 mg (06/18/19 1132)     Objective: Vitals:   06/18/19 2349 06/19/19 0303 06/19/19 0500 06/19/19 0752  BP: 125/75 130/75  (!) 156/68  Pulse: 86 86  (!) 121  Resp: (!) 26 (!) 25  (!) 26   Temp: 98.1 F (36.7 C) 98.8 F (37.1 C)  99.2 F (37.3 C)  TempSrc: Axillary Axillary  Axillary  SpO2: 92% 95%  90%  Weight:   81.9 kg   Height:        Intake/Output Summary (Last 24 hours) at 06/19/2019 0859 Last data filed at 06/19/2019 0300 Gross per 24 hour  Intake 75 ml  Output 700 ml  Net -625 ml   Filed Weights   06/16/19 1053 06/17/19 0359 06/19/19 0500  Weight: 77.1 kg 80.6 kg 81.9 kg   Physical Exam:  General: A/O x4, positive acute respiratory distress Eyes: negative scleral hemorrhage, negative anisocoria, negative icterus ENT: Negative Runny nose, negative gingival bleeding, Neck:  Negative scars, masses, torticollis, lymphadenopathy, JVD Lungs: Clear to auscultation bilaterally without wheezes or crackles Cardiovascular: Regular rate and rhythm without murmur gallop or rub normal S1 and S2 Abdomen: negative abdominal pain, nondistended, positive soft, bowel sounds, no rebound, no ascites, no appreciable mass, PEG tube in place negative sign  of infection Extremities: No significant cyanosis, clubbing, or edema bilateral lower extremities Skin: Negative rashes, lesions, ulcers Psychiatric: Unable to assess Central nervous system: Spontaneously moves right arm withdraws left arm to painful stimuli.  Bilateral lower extremities are fixed in fetal position     Data Reviewed: Care during the described time interval was provided by me .  I have reviewed this patient's available data, including medical history, events of note, physical examination, and all test results as part of my evaluation.   CBC: Recent Labs  Lab 06/16/19 1049 06/16/19 1140 06/17/19 0637 06/18/19 0342 06/19/19 0310  WBC 6.2  --  7.5 8.3 7.7  NEUTROABS 5.2  --  6.5 7.5 6.8  HGB 16.2 15.6 14.1 13.5 13.4  HCT 54.7* 46.0 45.6 44.0 42.6  MCV 84.2  --  82.0 81.0 80.2  PLT 199  --  194 225 252   Basic Metabolic Panel: Recent Labs  Lab 06/16/19 1049 06/16/19 1140 06/17/19 0637 06/18/19  0342 06/19/19 0310  NA 147* 148* 152* 150* 151*  K 4.7 4.4 4.8 4.2 4.0  CL 113*  --  120* 122* 120*  CO2 22  --  22 19* 20*  GLUCOSE 473*  --  173* 396* 411*  BUN 37*  --  28* 32* 29*  CREATININE 1.16  --  0.87 0.74 0.73  CALCIUM 8.9  --  8.4* 8.4* 8.1*  MG  --   --  2.2 2.4 2.3  PHOS  --   --  2.6 1.1* 2.2*   GFR: Estimated Creatinine Clearance: 105.1 mL/min (by C-G formula based on SCr of 0.73 mg/dL). Liver Function Tests: Recent Labs  Lab 06/16/19 1049 06/17/19 0637 06/18/19 0342 06/19/19 0310  AST 118* 90* 62* 57*  ALT 65* 51* 45* 47*  ALKPHOS 71 58 65 90  BILITOT 0.3 0.7 0.7 0.5  PROT 7.2 6.2* 6.4* 5.6*  ALBUMIN 3.2* 2.8* 2.9* 2.4*   No results for input(s): LIPASE, AMYLASE in the last 168 hours. No results for input(s): AMMONIA in the last 168 hours. Coagulation Profile: Recent Labs  Lab 06/16/19 1049  INR 1.0   Cardiac Enzymes: No results for input(s): CKTOTAL, CKMB, CKMBINDEX, TROPONINI in the last 168 hours. BNP (last 3 results) No results for input(s): PROBNP in the last 8760 hours. HbA1C: Recent Labs    06/16/19 1323  HGBA1C 5.9*   CBG: Recent Labs  Lab 06/18/19 1150 06/18/19 1927 06/18/19 2352 06/19/19 0322 06/19/19 0751  GLUCAP 435* 274* 360* 375* 306*   Lipid Profile: Recent Labs    06/16/19 1321  TRIG 137   Thyroid Function Tests: No results for input(s): TSH, T4TOTAL, FREET4, T3FREE, THYROIDAB in the last 72 hours. Anemia Panel: Recent Labs    06/18/19 0342 06/19/19 0310  FERRITIN 495* 477*   Urine analysis: No results found for: COLORURINE, APPEARANCEUR, LABSPEC, PHURINE, GLUCOSEU, HGBUR, BILIRUBINUR, KETONESUR, PROTEINUR, UROBILINOGEN, NITRITE, LEUKOCYTESUR Sepsis Labs: @LABRCNTIP (procalcitonin:4,lacticidven:4)  ) Recent Results (from the past 240 hour(s))  Blood Culture (routine x 2)     Status: None (Preliminary result)   Collection Time: 06/16/19 10:52 AM   Specimen: BLOOD LEFT HAND  Result Value Ref Range Status    Specimen Description BLOOD LEFT HAND  Final   Special Requests   Final    BOTTLES DRAWN AEROBIC AND ANAEROBIC Blood Culture adequate volume   Culture  Setup Time   Final    IN BOTH AEROBIC AND ANAEROBIC BOTTLES GRAM POSITIVE COCCI CRITICAL RESULT CALLED TO, READ BACK BY  AND VERIFIED WITH: Albertine GratesNDREA L [facility]AR[facility] 06/17/19 1720 JDW    Culture   Final    GRAM POSITIVE COCCI IDENTIFICATION TO FOLLOW Performed at [facility], [address] [place], [place] [address]    Report Status PENDING  Incomplete  Blood Culture (routine x 2)     Status: None (Preliminary result)   Collection Time: 06/16/19 11:10 AM   Specimen: BLOOD RIGHT ARM  Result Value Ref Range Status   Specimen Description BLOOD RIGHT ARM  Final   Special Requests   Final    BOTTLES DRAWN AEROBIC AND ANAEROBIC Blood Culture adequate volume   Culture   Final    NO GROWTH 2 DAYS Performed at [facility], [address] [place], [place] [address]    Report Status PENDING  Incomplete  MRSA PCR Screening     Status: None   Collection Time: 06/16/19  9:25 PM   Specimen: Nasal Mucosa; Nasopharyngeal  Result Value Ref Range Status   MRSA by PCR NEGATIVE NEGATIVE Final    Comment:        The GeneXpert MRSA Assay (FDA approved for NASAL specimens only), is one component of a comprehensive MRSA colonization surveillance program. It is not intended to diagnose MRSA infection nor to guide or monitor treatment for MRSA infections. Performed at [facility], [address] [place], [place] [address]          Radiology Studies: No results found.      Scheduled Meds: . vitamin C  500 mg Per Tube Daily  . aspirin  81 mg Per Tube Daily  . chlorhexidine  15 mL Mouth Rinse BID  . Chlorhexidine Gluconate Cloth  6 each Topical q morning - 10a  . dexamethasone (DECADRON) injection  6 mg Intravenous Q24H  . enoxaparin (LOVENOX) injection  40 mg Subcutaneous Q24H  . feeding supplement  (PRO-STAT SUGAR FREE 64)  30 mL Per Tube Daily  . free water  200 mL Per Tube QID  . insulin aspart  0-20 Units Subcutaneous Q4H  . insulin aspart  10 Units Subcutaneous QID  . insulin detemir  15 Units Subcutaneous Q24H  . Ipratropium-Albuterol  1 puff Inhalation QID  . mouth rinse  15 mL Mouth Rinse BID  . mouth rinse  15 mL Mouth Rinse q12n4p  . pantoprazole sodium  40 mg Per Tube QHS  . zinc sulfate  220 mg Per Tube Daily   Continuous Infusions: . sodium chloride 75 mL/hr at 06/19/19 0108  . ceFEPime (MAXIPIME) IV 2 g (06/19/19 0325)  . dextrose 5 % and 0.45% NaCl Stopped (06/17/19 0204)  . feeding supplement (OSMOLITE 1.5 CAL) 1,000 mL (06/17/19 1259)  . remdesivir 100 mg in NS 100 mL 100 mg (06/18/19 1132)     LOS: 3 days   The patient is critically ill with multiple organ systems failure and requires high complexity decision making for assessment and support, frequent evaluation and titration of therapies, application of advanced monitoring technologies and extensive interpretation of multiple databases. Critical Care Time devoted to patient care services described in this note  Time spent: 40 minutes     Marcelus Dubberly, Roselind MessierURTIS J, MD [facility] Pager [phone]  If 7PM-7AM, please contact night-coverage [email or web address] Password [email or web address] 06/19/2019, 8:59 AM

## 2019-06-19 NOTE — Progress Notes (Signed)
Pharmacy Antibiotic Note  Billy Nelson is a 63 y.o. male admitted from nursing home with history of ICH, paraplegia on 06/16/2019 with sepsis - unknown source.  Pharmacy has been consulted for cefepime dosing.   He is afebrile, WBC are normal, and renal function is stable. Cultures are neg thus far. Last dose of remdesivir tomorrow.   Plan: Cefepime 2g IV q8h Stop date of 5 days entered per [facility] note Pharmacy signing off - please re-consult if needed   Height: 6' (182.9 cm) Weight: 180 lb 8.9 oz (81.9 kg) IBW/kg (Calculated) : 77.6  Temp (24hrs), Avg:98.5 F (36.9 C), Min:97.9 F (36.6 C), Max:99.2 F (37.3 C)  Recent Labs  Lab 06/16/19 1049 06/16/19 1551 06/16/19 1820 06/17/19 0637 06/18/19 0342 06/19/19 0310  WBC 6.2  --   --  7.5 8.3 7.7  CREATININE 1.16  --   --  0.87 0.74 0.73  LATICACIDVEN 2.1* 2.3* 2.0*  --   --   --     Estimated Creatinine Clearance: 105.1 mL/min (by C-G formula based on SCr of 0.73 mg/dL).    No Known Allergies  Antimicrobials this admission: 12/28vancomycin >>12/29 12/28cefepime >> 12/20flagyl x 1 12/28 Remdesivir >> 1/1 12/29 Baricitinib >> 12/29 12/28 Dex 6mg  q24h >> 1/6  Dose adjustments this admission:   Microbiology results: 12/21 covid + 12/28 MRSA PCR: negative 12/28 BCID 1/4 CoNS (aerobic bottle)   Thank you for involving pharmacy in this patient's care.  Renold Genta, PharmD, BCPS Clinical Pharmacist Clinical phone for 06/19/2019 until 4p is [phone] 06/19/2019 11:21 AM  **Pharmacist phone directory can be found on [email or web address] listed under [place]**

## 2019-06-19 NOTE — Progress Notes (Signed)
Rapid Response Event Note  Overview: RRRN called to assess patient, MEWs score changed to red.      Initial Focused Assessment: No apparent respiratory distress. Oxygen requirements at 6L. Some coarse sounds.   Interventions: NT suction. Tolerated well.   Plan of Care (if not transferred): No escalation of care at this time.  If the bedside team has any other questions or concerns please call our team [phone] and we will be happy to support you.    Elspeth Cho

## 2019-06-19 NOTE — Progress Notes (Signed)
MEWS Guidelines - (patients age 62 and over)  Red - At High Risk for Deterioration Yellow - At risk for Deterioration  1. Go to room and assess patient 2. Validate data. Is this patient's baseline? If data confirmed: 3. Is this an acute change? 4. Administer prn meds/treatments as ordered. 5. Note Sepsis score 6. Review goals of care 7. Sports coach, RRT nurse and Provider. 8. Ask Provider to come to bedside.  9. Document patient condition/interventions/response. 10. Increase frequency of vital signs and focused assessments to at least q15 minutes x 4, then q30 minutes x2. - If stable, then q1h x3, then q4h x3 and then q8h or dept. routine. - If unstable, contact Provider & RRT nurse. Prepare for possible transfer. 11. Add entry in progress notes using the smart phrase ".MEWS". 1. Go to room and assess patient 2. Validate data. Is this patient's baseline? If data confirmed: 3. Is this an acute change? 4. Administer prn meds/treatments as ordered? 5. Note Sepsis score 6. Review goals of care 7. Sports coach and Provider 8. Call RRT nurse as needed. 9. Document patient condition/interventions/response. 10. Increase frequency of vital signs and focused assessments to at least q2h x2. - If stable, then q4h x2 and then q8h or dept. routine. - If unstable, contact Provider & RRT nurse. Prepare for possible transfer. 11. Add entry in progress notes using the smart phrase ".MEWS".  Green - Likely stable Lavender - Comfort Care Only  1. Continue routine/ordered monitoring.  2. Review goals of care. 1. Continue routine/ordered monitoring. 2. Review goals of care.    Red Guidelines implemented and documented in Chart

## 2019-06-20 DIAGNOSIS — J1282 Pneumonia due to coronavirus disease 2019: Secondary | ICD-10-CM

## 2019-06-20 LAB — GLUCOSE, CAPILLARY
Glucose-Capillary: 125 mg/dL — ABNORMAL HIGH (ref 70–99)
Glucose-Capillary: 243 mg/dL — ABNORMAL HIGH (ref 70–99)
Glucose-Capillary: 284 mg/dL — ABNORMAL HIGH (ref 70–99)
Glucose-Capillary: 284 mg/dL — ABNORMAL HIGH (ref 70–99)
Glucose-Capillary: 299 mg/dL — ABNORMAL HIGH (ref 70–99)
Glucose-Capillary: 83 mg/dL (ref 70–99)

## 2019-06-20 LAB — COMPREHENSIVE METABOLIC PANEL
ALT: 150 U/L — ABNORMAL HIGH (ref 0–44)
AST: 215 U/L — ABNORMAL HIGH (ref 15–41)
Albumin: 2.5 g/dL — ABNORMAL LOW (ref 3.5–5.0)
Alkaline Phosphatase: 97 U/L (ref 38–126)
Anion gap: 9 (ref 5–15)
BUN: 29 mg/dL — ABNORMAL HIGH (ref 8–23)
CO2: 22 mmol/L (ref 22–32)
Calcium: 8.4 mg/dL — ABNORMAL LOW (ref 8.9–10.3)
Chloride: 121 mmol/L — ABNORMAL HIGH (ref 98–111)
Creatinine, Ser: 0.73 mg/dL (ref 0.61–1.24)
GFR calc Af Amer: >60 mL/min (ref >60)
GFR calc non Af Amer: >60 mL/min (ref >60)
Glucose, Bld: 287 mg/dL — ABNORMAL HIGH (ref 70–99)
Potassium: 4.4 mmol/L (ref 3.5–5.1)
Sodium: 152 mmol/L — ABNORMAL HIGH (ref 135–145)
Total Bilirubin: 0.3 mg/dL (ref 0.3–1.2)
Total Protein: 6.1 g/dL — ABNORMAL LOW (ref 6.5–8.1)

## 2019-06-20 LAB — CBC WITH DIFFERENTIAL/PLATELET
Abs Immature Granulocytes: 0.08 10*3/uL — ABNORMAL HIGH (ref 0.00–0.07)
Basophils Absolute: 0 10*3/uL (ref 0.0–0.1)
Basophils Relative: 0 %
Eosinophils Absolute: 0 10*3/uL (ref 0.0–0.5)
Eosinophils Relative: 0 %
HCT: 43.9 % (ref 39.0–52.0)
Hemoglobin: 13.6 g/dL (ref 13.0–17.0)
Immature Granulocytes: 1 %
Lymphocytes Relative: 9 %
Lymphs Abs: 0.6 10*3/uL — ABNORMAL LOW (ref 0.7–4.0)
MCH: 24.8 pg — ABNORMAL LOW (ref 26.0–34.0)
MCHC: 31 g/dL (ref 30.0–36.0)
MCV: 80.1 fL (ref 80.0–100.0)
Monocytes Absolute: 0.4 10*3/uL (ref 0.1–1.0)
Monocytes Relative: 6 %
Neutro Abs: 5.6 10*3/uL (ref 1.7–7.7)
Neutrophils Relative %: 84 %
Platelets: 246 10*3/uL (ref 150–400)
RBC: 5.48 MIL/uL (ref 4.22–5.81)
RDW: 17.3 % — ABNORMAL HIGH (ref 11.5–15.5)
WBC: 6.7 10*3/uL (ref 4.0–10.5)
nRBC: 0.6 % — ABNORMAL HIGH (ref 0.0–0.2)

## 2019-06-20 LAB — D-DIMER, QUANTITATIVE: D-Dimer, Quant: 1.3 ug/mL-FEU — ABNORMAL HIGH (ref 0.00–0.50)

## 2019-06-20 LAB — PHOSPHORUS: Phosphorus: 2.4 mg/dL — ABNORMAL LOW (ref 2.5–4.6)

## 2019-06-20 LAB — FERRITIN: Ferritin: 586 ng/mL — ABNORMAL HIGH (ref 24–336)

## 2019-06-20 LAB — MAGNESIUM: Magnesium: 2.3 mg/dL (ref 1.7–2.4)

## 2019-06-20 LAB — C-REACTIVE PROTEIN: CRP: 1.8 mg/dL — ABNORMAL HIGH (ref <1.0)

## 2019-06-20 LAB — PROCALCITONIN: Procalcitonin: 0.49 ng/mL

## 2019-06-20 MED ORDER — FREE WATER
400.0000 mL | Freq: Four times a day (QID) | Status: DC
  Administered 2019-06-20 – 2019-06-21 (×4): 400 mL

## 2019-06-20 MED ORDER — INSULIN ASPART 100 UNIT/ML ~~LOC~~ SOLN
15.0000 [IU] | SUBCUTANEOUS | Status: DC
  Administered 2019-06-20 – 2019-06-21 (×6): 15 [IU] via SUBCUTANEOUS

## 2019-06-20 MED ORDER — INSULIN DETEMIR 100 UNIT/ML ~~LOC~~ SOLN
22.0000 [IU] | SUBCUTANEOUS | Status: DC
  Administered 2019-06-21: 01:00:00 22 [IU] via SUBCUTANEOUS
  Filled 2019-06-20 (×2): qty 0.22

## 2019-06-20 NOTE — Progress Notes (Addendum)
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 nursing home Sent to the ED on 12/28 for worsening hypoxia, respiratory distress Placed on nonrebreather and PCCM called for admission   Subjective: 1/1 afebrile last 24 hours.  Eyes open tracks around the room spontaneously moves right arm   Assessment & Plan:   Active Problems:   Acute respiratory failure due to COVID-19 (HCC)   Pressure injury of skin   Pneumonia due to COVID-19 virus   Diabetes mellitus type 2, controlled, with complications (HCC)   Quadriplegia (HCC)   History of intracranial hemorrhage   Essential hypertension   Acute respiratory failure with hypoxia (HCC)   Bacterial pneumonia   Hypernatremia   Pressure ulcer   Covid pneumonia/acute respiratory failure with hypoxia COVID-19 Labs  Recent Labs    06/18/19 0342 06/19/19 0310 06/20/19 0737  DDIMER 1.20* 1.16* 1.30*  FERRITIN 495* 477* 586*  CRP 10.9* 4.6* 1.8*    12/21 Covid coronavirus positive at [facility] -12/28 Barcitinib--> 12/29 DC'd -Remdesivir per pharmacy protocol -Decadron 6 mg daily -Combivent QID -Vitamins per Covid protocol -Titrate O2 to maintain SPO2> 88% -Flutter valve -Incentive spirometer  Bacterial pneumonia superinfection -Trend procalcitonin; slightly elevated, with lactic acidosis and fever will continue cefepime x5 days. -DC vancomycin -Trend procalcitonin Results for LASH, MATULICH (MRN [id number]) as of 06/19/2019 17:20  Ref. Range 06/16/2019 13:20 06/17/2019 06:37 06/18/2019 03:42 06/19/2019 03:10  Procalcitonin Latest Units: ng/mL 0.38 0.54 0.83 0.83    Hx intracranial hemorrhage/quadriplegia -Quadriplegic, nonverbal.  Does withdraw to painful stimuli, tracks  with his eyes  Diabetes type 2 controlled with complication -12/28 hemoglobin A1c= 5.9 -1/1 increase Levemir 22 units daily  -1/1 increase NovoLog 15 units q 4hr -Resistant SSI   Essential HTN -Currently controlled without medication monitor closely  Hypernatremia Recent Labs  Lab 06/16/19 1140 06/17/19 0637 06/18/19 0342 06/19/19 0310 06/20/19 0737  NA 148* 152* 150* 151* 152*  -1/1 increase free water 400 ml QID     Hypophosphatemia -Resolved  Pressure ulcer Pressure Injury 06/16/19 Coccyx Healed (Active)  06/16/19 1649  Location: Coccyx  Location Orientation:   Staging:   Wound Description (Comments): Healed  Present on Admission: Yes     Pressure Injury 06/16/19 Knee Anterior;Left;Medial Stage 2 -  Partial thickness loss of dermis presenting as a shallow open injury with a red, pink wound bed without slough. (Active)  06/16/19 1651  Location: Knee  Location Orientation: Anterior;Left;Medial  Staging: Stage 2 -  Partial thickness loss of dermis presenting as a shallow open injury with a red, pink wound bed without slough.  Wound Description (Comments):   Present on Admission: Yes     Pressure Injury 06/16/19 Knee Anterior;Right;Medial Deep Tissue Pressure Injury - Purple or maroon localized area of discolored intact skin or blood-filled blister due to damage of underlying soft tissue from pressure and/or shear. (Active)  06/16/19 1759  Location: Knee  Location Orientation: Anterior;Right;Medial  Staging: Deep Tissue Pressure Injury - Purple or maroon localized area of discolored intact skin or blood-filled blister due to damage of underlying soft tissue from pressure and/or shear.  Wound Description (Comments):   Present on Admission: Yes    Goals of care -1/1 spoke with NCM Vance Peper working on returning patient when ready to [facility]  DVT prophylaxis: Lovenox Code Status: Full Family Communication: 06/20/2019 spoke with Angelique Blonder (wife) explained  Plan of care answered all questions Disposition Plan: TBD   Consultants:    Procedures/Significant Events:  Chest x-ray 06/16/2019-bilateral infiltrates   I have personally reviewed and interpreted all radiology studies and my findings are as above.  VENTILATOR SETTINGS  Nasal cannula 1/1 Flow; 5 L/min SPO2; 98%   Cultures 12/28 HIV screen negative 12/28 MRSA by PCR negative 12/28 blood LEFT hand NGTD 12/28 blood RIGHT arm NGTD    Antimicrobials: Anti-infectives (From admission, onward)   Start     Dose/Rate Stop   06/17/19 1000  remdesivir 100 mg in sodium chloride 0.9 % 100 mL IVPB     100 mg 200 mL/hr over 30 Minutes 06/21/19 0959   06/17/19 0100  vancomycin (VANCOREADY) IVPB 750 mg/150 mL  Status:  Discontinued     750 mg 150 mL/hr over 60 Minutes 06/18/19 1001   06/16/19 1930  ceFEPIme (MAXIPIME) 2 g in sodium chloride 0.9 % 100 mL IVPB     2 g 200 mL/hr over 30 Minutes     06/16/19 1230  remdesivir 200 mg in sodium chloride 0.9% 250 mL IVPB     200 mg 580 mL/hr over 30 Minutes 06/16/19 1639   06/16/19 1045  ceFEPIme (MAXIPIME) 2 g in sodium chloride 0.9 % 100 mL IVPB     2 g 200 mL/hr over 30 Minutes 06/16/19 1154   06/16/19 1045  metroNIDAZOLE (FLAGYL) IVPB 500 mg     500 mg 100 mL/hr over 60 Minutes 06/16/19 1321   06/16/19 1045  vancomycin (VANCOCIN) IVPB 1000 mg/200 mL premix  Status:  Discontinued     1,000 mg 200 mL/hr over 60 Minutes 06/16/19 1044   06/16/19 1045  vancomycin (VANCOREADY) IVPB 2000 mg/400 mL     2,000 mg 200 mL/hr over 120 Minutes 06/16/19 1900       Devices    LINES / TUBES:      Continuous Infusions: . sodium chloride 75 mL/hr at 06/20/19 1100  . ceFEPime (MAXIPIME) IV 2 g (06/20/19 0330)  . dextrose 5 % and 0.45% NaCl Stopped (06/17/19 0204)  . feeding supplement (OSMOLITE 1.5 CAL) 1,000 mL (06/19/19 2133)     Objective: Vitals:   06/19/19 2340 06/20/19 0328 06/20/19 0333 06/20/19 0804  BP:    124/70  Pulse:     77  Resp: 18 18  18   Temp: 98 F (36.7 C) 98.1 F (36.7 C)  98.4 F (36.9 C)  TempSrc: Axillary Axillary  Axillary  SpO2:    93%  Weight:   81.1 kg   Height:        Intake/Output Summary (Last 24 hours) at 06/20/2019 1138 Last data filed at 06/20/2019 0600 Gross per 24 hour  Intake 5175.26 ml  Output 1500 ml  Net 3675.26 ml   Filed Weights   06/17/19 0359 06/19/19 0500 06/20/19 0333  Weight: 80.6 kg 81.9 kg 81.1 kg   Physical Exam:  General: Opens eyes tracks around room, follows no commands, positive acute respiratory distress Eyes: negative scleral hemorrhage, negative anisocoria, negative icterus ENT: Negative Runny nose, negative gingival bleeding, Neck:  Negative scars, masses, torticollis, lymphadenopathy, JVD Lungs: Clear to auscultation bilaterally without wheezes or crackles Cardiovascular: Regular rate and rhythm without murmur gallop or rub normal S1 and S2 Abdomen: negative abdominal pain, nondistended, positive soft, bowel sounds, no rebound, no ascites, no appreciable mass, PEG tube in place negative sign of  infection Extremities: No significant cyanosis, clubbing, or edema bilateral lower extremities Skin: Negative rashes, lesions, ulcers Psychiatric: Unable to assess secondary to patient's chronic altered status Central nervous system: Spontaneously moves right arm.  Withdraws left arm to painful stimuli.  Bilateral lower extremities are fixed in fetal position.     Data Reviewed: Care during the described time interval was provided by me .  I have reviewed this patient's available data, including medical history, events of note, physical examination, and all test results as part of my evaluation.   CBC: Recent Labs  Lab 06/16/19 1049 06/16/19 1140 06/17/19 0637 06/18/19 0342 06/19/19 0310 06/20/19 0737  WBC 6.2  --  7.5 8.3 7.7 6.7  NEUTROABS 5.2  --  6.5 7.5 6.8 5.6  HGB 16.2 15.6 14.1 13.5 13.4 13.6  HCT 54.7* 46.0 45.6 44.0 42.6 43.9  MCV 84.2   --  82.0 81.0 80.2 80.1  PLT 199  --  194 225 252 246   Basic Metabolic Panel: Recent Labs  Lab 06/16/19 1049 06/16/19 1140 06/17/19 0637 06/18/19 0342 06/19/19 0310 06/20/19 0737  NA 147* 148* 152* 150* 151* 152*  K 4.7 4.4 4.8 4.2 4.0 4.4  CL 113*  --  120* 122* 120* 121*  CO2 22  --  22 19* 20* 22  GLUCOSE 473*  --  173* 396* 411* 287*  BUN 37*  --  28* 32* 29* 29*  CREATININE 1.16  --  0.87 0.74 0.73 0.73  CALCIUM 8.9  --  8.4* 8.4* 8.1* 8.4*  MG  --   --  2.2 2.4 2.3 2.3  PHOS  --   --  2.6 1.1* 2.2* 2.4*   GFR: Estimated Creatinine Clearance: 105.1 mL/min (by C-G formula based on SCr of 0.73 mg/dL). Liver Function Tests: Recent Labs  Lab 06/16/19 1049 06/17/19 0637 06/18/19 0342 06/19/19 0310 06/20/19 0737  AST 118* 90* 62* 57* 215*  ALT 65* 51* 45* 47* 150*  ALKPHOS 71 58 65 90 97  BILITOT 0.3 0.7 0.7 0.5 0.3  PROT 7.2 6.2* 6.4* 5.6* 6.1*  ALBUMIN 3.2* 2.8* 2.9* 2.4* 2.5*   No results for input(s): LIPASE, AMYLASE in the last 168 hours. No results for input(s): AMMONIA in the last 168 hours. Coagulation Profile: Recent Labs  Lab 06/16/19 1049  INR 1.0   Cardiac Enzymes: No results for input(s): CKTOTAL, CKMB, CKMBINDEX, TROPONINI in the last 168 hours. BNP (last 3 results) No results for input(s): PROBNP in the last 8760 hours. HbA1C: No results for input(s): HGBA1C in the last 72 hours. CBG: Recent Labs  Lab 06/19/19 1524 06/19/19 1940 06/20/19 0041 06/20/19 0327 06/20/19 0803  GLUCAP 283* 319* 284* 243* 299*   Lipid Profile: No results for input(s): CHOL, HDL, LDLCALC, TRIG, CHOLHDL, LDLDIRECT in the last 72 hours. Thyroid Function Tests: No results for input(s): TSH, T4TOTAL, FREET4, T3FREE, THYROIDAB in the last 72 hours. Anemia Panel: Recent Labs    06/19/19 0310 06/20/19 0737  FERRITIN 477* 586*   Urine analysis: No results found for: COLORURINE, APPEARANCEUR, LABSPEC, PHURINE, GLUCOSEU, HGBUR, BILIRUBINUR, KETONESUR,  PROTEINUR, UROBILINOGEN, NITRITE, LEUKOCYTESUR Sepsis Labs: @LABRCNTIP (procalcitonin:4,lacticidven:4)  ) Recent Results (from the past 240 hour(s))  Blood Culture (routine x 2)     Status: Abnormal   Collection Time: 06/16/19 10:52 AM   Specimen: BLOOD LEFT HAND  Result Value Ref Range Status   Specimen Description BLOOD LEFT HAND  Final   Special Requests   Final    BOTTLES DRAWN AEROBIC AND  ANAEROBIC Blood Culture adequate volume   Culture  Setup Time   Final    IN BOTH AEROBIC AND ANAEROBIC BOTTLES GRAM POSITIVE COCCI CRITICAL RESULT CALLED TO, READ BACK BY AND VERIFIED WITH: ANDREA L PHARMD 06/17/19 1720 JDW    Culture (A)  Final    STAPHYLOCOCCUS SPECIES (COAGULASE NEGATIVE) THE SIGNIFICANCE OF ISOLATING THIS ORGANISM FROM A SINGLE SET OF BLOOD CULTURES WHEN MULTIPLE SETS ARE DRAWN IS UNCERTAIN. PLEASE NOTIFY THE MICROBIOLOGY DEPARTMENT WITHIN ONE WEEK IF SPECIATION AND SENSITIVITIES ARE REQUIRED. Performed at [facility], [address], [place], [place] [address]    Report Status 06/19/2019 FINAL  Final  Blood Culture (routine x 2)     Status: None (Preliminary result)   Collection Time: 06/16/19 11:10 AM   Specimen: BLOOD RIGHT ARM  Result Value Ref Range Status   Specimen Description BLOOD RIGHT ARM  Final   Special Requests   Final    BOTTLES DRAWN AEROBIC AND ANAEROBIC Blood Culture adequate volume   Culture   Final    NO GROWTH 3 DAYS Performed at [facility], [address] [place], [place] [address]    Report Status PENDING  Incomplete  MRSA PCR Screening     Status: None   Collection Time: 06/16/19  9:25 PM   Specimen: Nasal Mucosa; Nasopharyngeal  Result Value Ref Range Status   MRSA by PCR NEGATIVE NEGATIVE Final    Comment:        The GeneXpert MRSA Assay (FDA approved for NASAL specimens only), is one component of a comprehensive MRSA colonization surveillance program. It is not intended to diagnose MRSA infection nor to guide  or monitor treatment for MRSA infections. Performed at [facility], [address] [place], [place] [address]          Radiology Studies: DG CHEST PORT 1 VIEW  Result Date: 06/19/2019 CLINICAL DATA:  Shortness of breath and history of COVID-19 positivity. EXAM: PORTABLE CHEST 1 VIEW COMPARISON:  06/16/2019 FINDINGS: Cardiac shadow is stable. Patchy opacities are identified bilaterally slightly increased when compared with the prior exam consistent with the patient's given clinical history. No other focal abnormality is noted. IMPRESSION: Increase in patchy opacities when compared with the prior exam consistent with the patient's given clinical history. Electronically Signed   By: Alcide Clever M.D.   On: 06/19/2019 10:30        Scheduled Meds: . vitamin C  500 mg Per Tube Daily  . aspirin  81 mg Per Tube Daily  . chlorhexidine  15 mL Mouth Rinse BID  . Chlorhexidine Gluconate Cloth  6 each Topical q morning - 10a  . dexamethasone (DECADRON) injection  6 mg Intravenous Q24H  . enoxaparin (LOVENOX) injection  40 mg Subcutaneous Q24H  . feeding supplement (PRO-STAT SUGAR FREE 64)  30 mL Per Tube Daily  . free water  200 mL Per Tube QID  . insulin aspart  0-20 Units Subcutaneous Q4H  . insulin aspart  10 Units Subcutaneous Q4H  . insulin detemir  15 Units Subcutaneous Q24H  . Ipratropium-Albuterol  1 puff Inhalation QID  . mouth rinse  15 mL Mouth Rinse BID  . mouth rinse  15 mL Mouth Rinse q12n4p  . pantoprazole sodium  40 mg Per Tube QHS  . zinc sulfate  220 mg Per Tube Daily   Continuous Infusions: . sodium chloride 75 mL/hr at 06/20/19 1100  . ceFEPime (MAXIPIME) IV 2 g (06/20/19 0330)  .  dextrose 5 % and 0.45% NaCl Stopped (06/17/19 0204)  . feeding supplement (OSMOLITE 1.5 CAL) 1,000 mL (06/19/19 2133)     LOS: 4 days   The patient is critically ill with multiple organ systems failure and requires high complexity decision making for assessment  and support, frequent evaluation and titration of therapies, application of advanced monitoring technologies and extensive interpretation of multiple databases. Critical Care Time devoted to patient care services described in this note  Time spent: 40 minutes     Sharetha Newson, Geraldo Docker, MD [facility] Pager [phone]  If 7PM-7AM, please contact night-coverage [email or web address] Password [facility] 06/20/2019, 11:38 AM

## 2019-06-21 DIAGNOSIS — J449 Chronic obstructive pulmonary disease, unspecified: Secondary | ICD-10-CM | POA: Insufficient documentation

## 2019-06-21 DIAGNOSIS — G8250 Quadriplegia, unspecified: Secondary | ICD-10-CM | POA: Insufficient documentation

## 2019-06-21 DIAGNOSIS — Z794 Long term (current) use of insulin: Secondary | ICD-10-CM | POA: Insufficient documentation

## 2019-06-21 DIAGNOSIS — U071 COVID-19: Secondary | ICD-10-CM | POA: Diagnosis not present

## 2019-06-21 DIAGNOSIS — Z7982 Long term (current) use of aspirin: Secondary | ICD-10-CM | POA: Insufficient documentation

## 2019-06-21 DIAGNOSIS — Z79899 Other long term (current) drug therapy: Secondary | ICD-10-CM | POA: Insufficient documentation

## 2019-06-21 LAB — CBC WITH DIFFERENTIAL/PLATELET
Abs Immature Granulocytes: 0.1 10*3/uL — ABNORMAL HIGH (ref 0.00–0.07)
Basophils Absolute: 0 10*3/uL (ref 0.0–0.1)
Basophils Relative: 1 %
Eosinophils Absolute: 0 10*3/uL (ref 0.0–0.5)
Eosinophils Relative: 0 %
HCT: 48.2 % (ref 39.0–52.0)
Hemoglobin: 15.2 g/dL (ref 13.0–17.0)
Immature Granulocytes: 1 %
Lymphocytes Relative: 10 %
Lymphs Abs: 0.9 10*3/uL (ref 0.7–4.0)
MCH: 25.3 pg — ABNORMAL LOW (ref 26.0–34.0)
MCHC: 31.5 g/dL (ref 30.0–36.0)
MCV: 80.3 fL (ref 80.0–100.0)
Monocytes Absolute: 0.5 10*3/uL (ref 0.1–1.0)
Monocytes Relative: 6 %
Neutro Abs: 6.9 10*3/uL (ref 1.7–7.7)
Neutrophils Relative %: 82 %
Platelets: 282 10*3/uL (ref 150–400)
RBC: 6 MIL/uL — ABNORMAL HIGH (ref 4.22–5.81)
RDW: 18 % — ABNORMAL HIGH (ref 11.5–15.5)
WBC: 8.3 10*3/uL (ref 4.0–10.5)
nRBC: 1.6 % — ABNORMAL HIGH (ref 0.0–0.2)

## 2019-06-21 LAB — CULTURE, BLOOD (ROUTINE X 2)
Culture: NO GROWTH
Special Requests: ADEQUATE

## 2019-06-21 LAB — FERRITIN: Ferritin: 309 ng/mL (ref 24–336)

## 2019-06-21 LAB — COMPREHENSIVE METABOLIC PANEL
ALT: 125 U/L — ABNORMAL HIGH (ref 0–44)
AST: 97 U/L — ABNORMAL HIGH (ref 15–41)
Albumin: 2.4 g/dL — ABNORMAL LOW (ref 3.5–5.0)
Alkaline Phosphatase: 100 U/L (ref 38–126)
Anion gap: 10 (ref 5–15)
BUN: 29 mg/dL — ABNORMAL HIGH (ref 8–23)
CO2: 21 mmol/L — ABNORMAL LOW (ref 22–32)
Calcium: 8.3 mg/dL — ABNORMAL LOW (ref 8.9–10.3)
Chloride: 118 mmol/L — ABNORMAL HIGH (ref 98–111)
Creatinine, Ser: 0.72 mg/dL (ref 0.61–1.24)
GFR calc Af Amer: >60 mL/min (ref >60)
GFR calc non Af Amer: >60 mL/min (ref >60)
Glucose, Bld: 320 mg/dL — ABNORMAL HIGH (ref 70–99)
Potassium: 4.6 mmol/L (ref 3.5–5.1)
Sodium: 149 mmol/L — ABNORMAL HIGH (ref 135–145)
Total Bilirubin: 0.6 mg/dL (ref 0.3–1.2)
Total Protein: 5.9 g/dL — ABNORMAL LOW (ref 6.5–8.1)

## 2019-06-21 LAB — C-REACTIVE PROTEIN: CRP: 1.7 mg/dL — ABNORMAL HIGH (ref <1.0)

## 2019-06-21 LAB — MAGNESIUM: Magnesium: 2.2 mg/dL (ref 1.7–2.4)

## 2019-06-21 LAB — GLUCOSE, CAPILLARY
Glucose-Capillary: 154 mg/dL — ABNORMAL HIGH (ref 70–99)
Glucose-Capillary: 231 mg/dL — ABNORMAL HIGH (ref 70–99)
Glucose-Capillary: 258 mg/dL — ABNORMAL HIGH (ref 70–99)
Glucose-Capillary: 263 mg/dL — ABNORMAL HIGH (ref 70–99)
Glucose-Capillary: 267 mg/dL — ABNORMAL HIGH (ref 70–99)

## 2019-06-21 LAB — D-DIMER, QUANTITATIVE: D-Dimer, Quant: 6.49 ug/mL-FEU — ABNORMAL HIGH (ref 0.00–0.50)

## 2019-06-21 LAB — PHOSPHORUS: Phosphorus: 2.3 mg/dL — ABNORMAL LOW (ref 2.5–4.6)

## 2019-06-21 MED ORDER — ZINC SULFATE 220 (50 ZN) MG PO CAPS: 220.0000 mg | ORAL_CAPSULE | Freq: Every day | ORAL | 0 refills | Status: AC

## 2019-06-21 MED ORDER — IPRATROPIUM-ALBUTEROL 20-100 MCG/ACT IN AERS: 1.0000 | INHALATION_SPRAY | Freq: Four times a day (QID) | RESPIRATORY_TRACT | 0 refills | Status: AC

## 2019-06-21 MED ORDER — ASPIRIN 81 MG PO CHEW: 81.0000 mg | CHEWABLE_TABLET | Freq: Every day | ORAL | 0 refills | Status: AC

## 2019-06-21 MED ORDER — ASCORBIC ACID 500 MG PO TABS: 500.0000 mg | ORAL_TABLET | Freq: Every day | ORAL | 0 refills | Status: AC

## 2019-06-21 MED ORDER — FREE WATER: 400.0000 mL | Freq: Four times a day (QID) | 0 refills | Status: AC

## 2019-06-21 MED ORDER — INSULIN DETEMIR 100 UNIT/ML FLEXPEN: 25.0000 [IU] | Freq: Every day | SUBCUTANEOUS | 0 refills | Status: AC

## 2019-06-21 MED ORDER — "PEN NEEDLES 1/2"" 29G X 12MM MISC": 0 refills | Status: AC

## 2019-06-21 MED ORDER — DEXAMETHASONE SODIUM PHOSPHATE 10 MG/ML IJ SOLN: 6.0000 mg | INTRAMUSCULAR | 0 refills | Status: AC

## 2019-06-21 MED ORDER — PANTOPRAZOLE SODIUM 40 MG PO PACK: 40.0000 mg | PACK | Freq: Every day | ORAL | 0 refills | Status: AC

## 2019-06-21 MED ORDER — NOVOLOG FLEXPEN 100 UNIT/ML ~~LOC~~ SOPN: 15.0000 [IU] | PEN_INJECTOR | SUBCUTANEOUS | 0 refills | Status: AC

## 2019-06-21 NOTE — Plan of Care (Signed)

## 2019-06-21 NOTE — TOC Transition Note (Signed)
Transition of Care [employer] [facility]) - CM/SW Discharge Note   Patient Details  Name: Billy Nelson MRN: [id number] Date of Birth: May 07, 1956  Transition of Care [facility]) CM/SW Contact:  Durenda Guthrie, RN Phone Number: [phone] (working remotely) 06/21/2019, 9:13 AM   Clinical Narrative:    Patient will DC to: [facility] Anticipated DC date: 06/21/19 Family notified: Wife- Kaidin Boehle Transport by: Sharin Mons: 12noon  Patient is medically ready to discharge back to [facility]. Case Manager spoke with Velna Hatchet, Admission coordinator at [facility] to Confirm bed available. She requests that DC summary be printed and sent with patient at transport. Case manager has notified MD, charge nurse and bedside RN patient has a bed. Beside RN to call report to [phone]. DC Summary, FL2 , Medical necessity and demographics will be printed to nursing unit .  Final next level of care: Skilled Nursing Facility Barriers to Discharge: No Barriers Identified   Patient Goals and CMS Choice Patient states their goals for this hospitalization and ongoing recovery are:: per wife: to get better      Discharge Placement: returning to [facility]                       Discharge Plan and Services   Discharge Planning Services: CM Consult, Other - See comment(arrange transport back to facility) Post Acute Care Choice: Skilled Nursing Facility            DME Agency: NA       HH Arranged: NA HH Agency: NA        Social Determinants of Health (SDOH) Interventions     Readmission Risk Interventions No flowsheet data found.

## 2019-06-21 NOTE — NC FL2 (Signed)
[place] MEDICAID FL2 LEVEL OF CARE SCREENING TOOL     IDENTIFICATION  Patient Name: Billy Nelson Birthdate: [birth year] Sex: male Admission Date (Current Location): 06/16/2019  [facility] and [place] Number:  Producer, television/film/video and Address:  [facility], [address] [place], [place] [address]([address]      Provider Number: [id number]  Attending Physician Name and Address:  Drema Dallas, MD  Relative Name and Phone Number:  Wife: Ronrico Dupin  [phone]    Current Level of Care: Hospital Recommended Level of Care: Skilled Nursing Facility Prior Approval Number:    Date Approved/Denied:   PASRR Number: [id number]  Discharge Plan: SNF    Current Diagnoses: Patient Active Problem List   Diagnosis Date Noted  . Pressure injury of skin 06/17/2019  . Pneumonia due to COVID-19 virus 06/17/2019  . Diabetes mellitus type 2, controlled, with complications (HCC) 06/17/2019  . Quadriplegia (HCC) 06/17/2019  . History of intracranial hemorrhage 06/17/2019  . Essential hypertension 06/17/2019  . Acute respiratory failure with hypoxia (HCC) 06/17/2019  . Bacterial pneumonia 06/17/2019  . Hypernatremia 06/17/2019  . Pressure ulcer 06/17/2019  . Acute respiratory failure due to COVID-19 (HCC) 06/16/2019    Orientation RESPIRATION BLADDER Height & Weight     Time, Situation, Self, Place  Normal Continent Weight: 81.1 kg Height:  6' (182.9 cm)  BEHAVIORAL SYMPTOMS/MOOD NEUROLOGICAL BOWEL NUTRITION STATUS      Continent Diet  AMBULATORY STATUS COMMUNICATION OF NEEDS Skin   Extensive Assist Verbally Normal                       Personal Care Assistance Level of Assistance  Dressing, Bathing Bathing Assistance: Maximum assistance   Dressing Assistance: Maximum assistance     Functional Limitations Info             SPECIAL CARE FACTORS FREQUENCY  PT (By licensed PT), OT (By licensed OT)     PT Frequency:  5x/week OT Frequency: min 3x/week            Contractures Contractures Info: Not present    Additional Factors Info  Code Status, Allergies Code Status Info: Full code Allergies Info: no known allergies           Current Medications (06/21/2019):  This is the current hospital active medication list Current Facility-Administered Medications  Medication Dose Route Frequency Provider Last Rate Last Admin  . 0.9 %  sodium chloride infusion   Intravenous Continuous Dorthea Cove, NP   Stopped at 06/20/19 1947  . acetaminophen (TYLENOL) tablet 650 mg  650 mg Per Tube Q6H PRN Dorthea Cove, NP   650 mg at 06/17/19 0005  . ascorbic acid (VITAMIN C) tablet 500 mg  500 mg Per Tube Daily Dorthea Cove, NP   500 mg at 06/20/19 1054  . aspirin chewable tablet 81 mg  81 mg Per Tube Daily Ilda Basset, RPH   81 mg at 06/20/19 1054  . chlorhexidine (PERIDEX) 0.12 % solution 15 mL  15 mL Mouth Rinse BID Coletta Memos, MD   15 mL at 06/20/19 2137  . Chlorhexidine Gluconate Cloth 2 % PADS 6 each  6 each Topical q morning - 10a Coletta Memos, MD   6 each at 06/20/19 1101  . dexamethasone (DECADRON) injection 6 mg  6 mg Intravenous Q24H Dorthea Cove, NP   6 mg at 06/20/19 1540  . dextrose 5 %-0.45 %  sodium chloride infusion   Intravenous Continuous Allie Bossier, MD   Stopped at 06/17/19 [phone]  . dextrose 50 % solution 0-50 mL  0-50 mL Intravenous PRN Alfonzo Feller, NP      . enoxaparin (LOVENOX) injection 40 mg  40 mg Subcutaneous Q24H Alfonzo Feller, NP   40 mg at 06/20/19 1541  . feeding supplement (OSMOLITE 1.5 CAL) liquid 1,000 mL  1,000 mL Per Tube Continuous Allie Bossier, MD 60 mL/hr at 06/21/19 0600 Rate Verify at 06/21/19 0600  . feeding supplement (PRO-STAT SUGAR FREE 64) liquid 30 mL  30 mL Per Tube Daily Allie Bossier, MD   30 mL at 06/20/19 1054  . free water 400 mL  400 mL Per Tube QID Allie Bossier, MD   400 mL at 06/20/19 2139  . insulin aspart (novoLOG)  injection 0-20 Units  0-20 Units Subcutaneous Q4H Allie Bossier, MD   7 Units at 06/21/19 0457  . insulin aspart (novoLOG) injection 15 Units  15 Units Subcutaneous Q4H Allie Bossier, MD   15 Units at 06/21/19 0457  . insulin detemir (LEVEMIR) injection 22 Units  22 Units Subcutaneous Q24H Allie Bossier, MD   22 Units at 06/21/19 0045  . Ipratropium-Albuterol (COMBIVENT) respimat 1 puff  1 puff Inhalation QID Allie Bossier, MD   1 puff at 06/19/19 1944  . MEDLINE mouth rinse  15 mL Mouth Rinse BID Allie Bossier, MD   15 mL at 06/20/19 2139  . MEDLINE mouth rinse  15 mL Mouth Rinse q12n4p Peyton Bottoms, MD   15 mL at 06/19/19 1858  . pantoprazole sodium (PROTONIX) 40 mg/20 mL oral suspension 40 mg  40 mg Per Tube QHS Alvira Philips, RPH   40 mg at 06/20/19 2324  . zinc sulfate capsule 220 mg  220 mg Per Tube Daily Alfonzo Feller, NP   220 mg at 06/20/19 1055     Discharge Medications: Please see discharge summary for a list of discharge medications.  Relevant Imaging Results:  Relevant Lab Results:   Additional Information SSN: [id number]  Ninfa Meeker, RN

## 2019-06-21 NOTE — Discharge Summary (Signed)
Physician Discharge Summary  [name] HER:[id number] DOB: Aug 26, 1955 DOA: 06/16/2019  PCP: Cheyenne Adas  Admit date: 06/16/2019 Discharge date: 06/21/2019  Time spent: 30 minutes  Recommendations for Outpatient Follow-up: Covid pneumonia/acute respiratory failure with hypoxia COVID-19 Labs  Recent Labs (last 2 labs)        Recent Labs    06/18/19 0342 06/19/19 0310 06/20/19 0737  DDIMER 1.20* 1.16* 1.30*  FERRITIN 495* 477* 586*  CRP 10.9* 4.6* 1.8*      12/21 Covid coronavirus positive at [facility] -12/28 Barcitinib--> 12/29 DC'd -Remdesivir per pharmacy protocol -Decadron 6 mg daily -Combivent QID -Vitamins per Covid protocol -Titrate O2 to maintain SPO2> 88% -Flutter valve -Incentive spirometer  Bacterial pneumonia superinfection -Trend procalcitonin; slightly elevated, with lactic acidosis and fever will continue cefepime x5 days. -DC vancomycin -Trend procalcitonin Results for Billy Nelson, Billy Nelson (MRN [id number]) as of 06/19/2019 17:20  Ref. Range 06/16/2019 13:20 06/17/2019 06:37 06/18/2019 03:42 06/19/2019 03:10  Procalcitonin Latest Units: ng/mL 0.38 0.54 0.83 0.83  -Patient unable to perform ambulatory SPO2 secondary to quadriplegia.  However patient does qualify for home O2.  3 L O2 via [place], titrate to maintain SPO2> 88%  Hx intracranial hemorrhage/quadriplegia -Quadriplegic, nonverbal.  Does withdraw to painful stimuli, tracks with his eyes  Diabetes type 2 controlled with complication -12/28 hemoglobin A1c= 5.9 -Levemir 25 units daily  -NovoLog 15 units q 4hr  Essential HTN -Currently controlled without medication monitor closely  Hypernatremia Last Labs          Recent Labs  Lab 06/16/19 1140 06/17/19 0637 06/18/19 0342 06/19/19 0310 06/20/19 0737  NA 148* 152* 150* 151* 152*    -1/1 increase free water 400 ml QID     Hypophosphatemia -Resolved  Pressure ulcer Pressure Injury 06/16/19 Coccyx Healed (Active)  06/16/19  1649  Location: Coccyx  Location Orientation:   Staging:   Wound Description (Comments): Healed  Present on Admission: Yes     Pressure Injury 06/16/19 Knee Anterior;Left;Medial Stage 2 -  Partial thickness loss of dermis presenting as a shallow open injury with a red, pink wound bed without slough. (Active)  06/16/19 1651  Location: Knee  Location Orientation: Anterior;Left;Medial  Staging: Stage 2 -  Partial thickness loss of dermis presenting as a shallow open injury with a red, pink wound bed without slough.  Wound Description (Comments):   Present on Admission: Yes     Pressure Injury 06/16/19 Knee Anterior;Right;Medial Deep Tissue Pressure Injury - Purple or maroon localized area of discolored intact skin or blood-filled blister due to damage of underlying soft tissue from pressure and/or shear. (Active)  06/16/19 1759  Location: Knee  Location Orientation: Anterior;Right;Medial  Staging: Deep Tissue Pressure Injury - Purple or maroon localized area of discolored intact skin or blood-filled blister due to damage of underlying soft tissue from pressure and/or shear.  Wound Description (Comments):   Present on Admission: Yes    Discharge Diagnoses:  Active Problems:   Acute respiratory failure due to COVID-19 (HCC)   Pressure injury of skin   Pneumonia due to COVID-19 virus   Diabetes mellitus type 2, controlled, with complications (HCC)   Quadriplegia (HCC)   History of intracranial hemorrhage   Essential hypertension   Acute respiratory failure with hypoxia (HCC)   Bacterial pneumonia   Hypernatremia   Pressure ulcer   Discharge Condition: Guarded  Diet recommendation: Tube feed  Filed Weights   06/17/19 0359 06/19/19 0500 06/20/19 0333  Weight: 80.6 kg 81.9 kg 81.1 kg  History of present illness:  64 year old BM PMHx 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, diabetes type 2 controlled with complication, HTN,[facility] Sent to the ED on 12/28 for worsening hypoxia, respiratory distress Placed on nonrebreather and PCCM called for admission  Hospital Course:  During his hospitalization patient was treated for Covid pneumonia/acute respiratory failure with hypoxia patient has responded well and is stable for discharge  Procedures: Chest x-ray 06/16/2019-bilateral infiltrates  VENTILATOR SETTINGS  Nasal cannula 1/1 Flow; 5 L/min SPO2; 98%  Cultures  12/28 HIV screen negative 12/28 MRSA by PCR negative 12/28 blood LEFT hand NGTD 12/28 blood RIGHT arm NGTD  Antibiotics Anti-infectives (From admission, onward)   Start     Dose/Rate Stop   06/17/19 1000  remdesivir 100 mg in sodium chloride 0.9 % 100 mL IVPB     100 mg 200 mL/hr over 30 Minutes 06/20/19 1947   06/17/19 0100  vancomycin (VANCOREADY) IVPB 750 mg/150 mL  Status:  Discontinued     750 mg 150 mL/hr over 60 Minutes 06/18/19 1001   06/16/19 1930  ceFEPIme (MAXIPIME) 2 g in sodium chloride 0.9 % 100 mL IVPB     2 g 200 mL/hr over 30 Minutes 06/21/19 0319   06/16/19 1230  remdesivir 200 mg in sodium chloride 0.9% 250 mL IVPB     200 mg 580 mL/hr over 30 Minutes 06/16/19 1639   06/16/19 1045  ceFEPIme (MAXIPIME) 2 g in sodium chloride 0.9 % 100 mL IVPB     2 g 200 mL/hr over 30 Minutes 06/16/19 1154   06/16/19 1045  metroNIDAZOLE (FLAGYL) IVPB 500 mg     500 mg 100 mL/hr over 60 Minutes 06/16/19 1321   06/16/19 1045  vancomycin (VANCOCIN) IVPB 1000 mg/200 mL premix  Status:  Discontinued     1,000 mg 200 mL/hr over 60 Minutes 06/16/19 1044   06/16/19 1045  vancomycin (VANCOREADY) IVPB 2000 mg/400 mL     2,000 mg 200 mL/hr over 120 Minutes 06/16/19 1900       Discharge Exam: Vitals:   06/21/19 0000 06/21/19 0400 06/21/19 0700 06/21/19 0800  BP: 125/73 138/80  113/68  Pulse: 82 74 84 86  Resp: 20 20    Temp: 98.3 F (36.8 C) 98.1 F (36.7 C)  98.7 F (37.1 C)  TempSrc: Axillary Axillary   Axillary  SpO2: 97% 98% 97% 97%  Weight:      Height:        General: Opens eyes tracks around room, follows no commands, positive acute respiratory distress Eyes: negative scleral hemorrhage, negative anisocoria, negative icterus ENT: Negative Runny nose, negative gingival bleeding, Neck:  Negative scars, masses, torticollis, lymphadenopathy, JVD Lungs: Clear to auscultation bilaterally without wheezes or crackles Cardiovascular: Regular rate and rhythm without murmur gallop or rub normal S1 and S2   Discharge Instructions   Allergies as of 06/21/2019   No Known Allergies     Medication List    STOP taking these medications   hydrALAZINE 50 MG tablet Commonly known as: APRESOLINE   labetalol 200 MG tablet Commonly known as: NORMODYNE     TAKE these medications   acetaminophen 325 MG tablet Commonly known as: TYLENOL Place 650 mg into feeding tube every 4 (four) hours as needed for fever.   ascorbic acid 500 MG tablet Commonly known as: VITAMIN C Place 1 tablet (500 mg total) into feeding tube daily.   aspirin 81 MG chewable tablet Place 1  tablet (81 mg total) into feeding tube daily.   dexamethasone 10 MG/ML injection Commonly known as: DECADRON Inject 0.6 mLs (6 mg total) into the vein daily.   Eliquis 2.5 MG Tabs tablet Generic drug: apixaban Take 2.5 mg by mouth 2 (two) times daily. For 21 days   free water Soln Place 400 mLs into feeding tube 4 (four) times daily.   insulin detemir 100 unit/ml Soln Commonly known as: LEVEMIR Inject 0.25 mLs (25 Units total) into the skin daily.   Ipratropium-Albuterol 20-100 MCG/ACT Aers respimat Commonly known as: COMBIVENT Inhale 1 puff into the lungs 4 (four) times daily.   ISOSOURCE 1.5 CAL PO by PEG Tube route. 40ml/hr  50 cc water flush. Given at 1800 and turn off at 0800.   NovoLOG FlexPen 100 UNIT/ML FlexPen Generic drug: insulin aspart Inject 15 Units into the skin every 4 (four) hours.   pantoprazole  sodium 40 mg/20 mL Pack Commonly known as: PROTONIX Place 20 mLs (40 mg total) into feeding tube at bedtime.   PEN NEEDLES 29GX1/2" 29G X Misc Use as directed   Vitamin D 50 MCG (2000 UT) Caps Take 2,000 Units by mouth daily.   zinc oxide 11.3 % Crea cream Commonly known as: BALMEX Apply 1 application topically 2 (two) times daily as needed. Apply to buttocks for redness or irritation   zinc sulfate 220 (50 Zn) MG capsule Place 1 capsule (220 mg total) into feeding tube daily.            Durable Medical Equipment  (From admission, onward)         Start     Ordered   06/21/19 0906  For home use only DME oxygen  Once    Comments: Patient unable to perform ambulatory SPO2 secondary to being quadriplegic. 4 L O2 via [place] titrate to maintain SPO2> 88%  Question Answer Comment  Length of Need 12 Months   Mode or (Route) Nasal cannula   Liters per Minute 4   Frequency Continuous (stationary and portable oxygen unit needed)   Oxygen conserving device Yes   Oxygen delivery system Gas      06/21/19 0907         No Known Allergies    The results of significant diagnostics from this hospitalization (including imaging, microbiology, ancillary and laboratory) are listed below for reference.    Significant Diagnostic Studies: DG CHEST PORT 1 VIEW  Result Date: 06/19/2019 CLINICAL DATA:  Shortness of breath and history of COVID-19 positivity. EXAM: PORTABLE CHEST 1 VIEW COMPARISON:  06/16/2019 FINDINGS: Cardiac shadow is stable. Patchy opacities are identified bilaterally slightly increased when compared with the prior exam consistent with the patient's given clinical history. No other focal abnormality is noted. IMPRESSION: Increase in patchy opacities when compared with the prior exam consistent with the patient's given clinical history. Electronically Signed   By: Alcide Clever M.D.   On: 06/19/2019 10:30   DG Chest Port 1 View  Result Date: 06/16/2019 CLINICAL DATA:   History of COVID, altered mental status, COVID positive EXAM: PORTABLE CHEST 1 VIEW COMPARISON:  No pertinent prior studies available for comparison. FINDINGS: Limited evaluation due to artifacts overlying the right hemithorax. Additionally, the patient's chin obscures the right lung apex and the patient is significantly rotated to the right. Heart size within normal limits. Ill-defined airspace opacities throughout both lungs. No evidence of pneumothorax within described limitations. No sizable pleural effusion. No acute bony abnormality. IMPRESSION: Limited examination as described. Ill-defined airspace  opacities throughout both lungs consistent with pneumonia given provided history. Electronically Signed   By: Jackey Loge DO   On: 06/16/2019 11:12    Microbiology: Recent Results (from the past 240 hour(s))  Blood Culture (routine x 2)     Status: Abnormal   Collection Time: 06/16/19 10:52 AM   Specimen: BLOOD LEFT HAND  Result Value Ref Range Status   Specimen Description BLOOD LEFT HAND  Final   Special Requests   Final    BOTTLES DRAWN AEROBIC AND ANAEROBIC Blood Culture adequate volume   Culture  Setup Time   Final    IN BOTH AEROBIC AND ANAEROBIC BOTTLES GRAM POSITIVE COCCI CRITICAL RESULT CALLED TO, READ BACK BY AND VERIFIED WITH: ANDREA L PHARMD 06/17/19 1720 JDW    Culture (A)  Final    STAPHYLOCOCCUS SPECIES (COAGULASE NEGATIVE) THE SIGNIFICANCE OF ISOLATING THIS ORGANISM FROM A SINGLE SET OF BLOOD CULTURES WHEN MULTIPLE SETS ARE DRAWN IS UNCERTAIN. PLEASE NOTIFY THE MICROBIOLOGY DEPARTMENT WITHIN ONE WEEK IF SPECIATION AND SENSITIVITIES ARE REQUIRED. Performed at [facility], [address], [place], [place] [address]    Report Status 06/19/2019 FINAL  Final  Blood Culture (routine x 2)     Status: None   Collection Time: 06/16/19 11:10 AM   Specimen: BLOOD RIGHT ARM  Result Value Ref Range Status   Specimen Description BLOOD RIGHT ARM  Final   Special Requests   Final     BOTTLES DRAWN AEROBIC AND ANAEROBIC Blood Culture adequate volume   Culture   Final    NO GROWTH 5 DAYS Performed at [facility], [address], [place], [place] [address]    Report Status 06/21/2019 FINAL  Final  MRSA PCR Screening     Status: None   Collection Time: 06/16/19  9:25 PM   Specimen: Nasal Mucosa; Nasopharyngeal  Result Value Ref Range Status   MRSA by PCR NEGATIVE NEGATIVE Final    Comment:        The GeneXpert MRSA Assay (FDA approved for NASAL specimens only), is one component of a comprehensive MRSA colonization surveillance program. It is not intended to diagnose MRSA infection nor to guide or monitor treatment for MRSA infections. Performed at [facility], [address], [place], [place] [address]      Labs: Basic Metabolic Panel: Recent Labs  Lab 06/16/19 1049 06/16/19 1140 06/17/19 0637 06/18/19 0342 06/19/19 0310 06/20/19 0737  NA 147* 148* 152* 150* 151* 152*  K 4.7 4.4 4.8 4.2 4.0 4.4  CL 113*  --  120* 122* 120* 121*  CO2 22  --  22 19* 20* 22  GLUCOSE 473*  --  173* 396* 411* 287*  BUN 37*  --  28* 32* 29* 29*  CREATININE 1.16  --  0.87 0.74 0.73 0.73  CALCIUM 8.9  --  8.4* 8.4* 8.1* 8.4*  MG  --   --  2.2 2.4 2.3 2.3  PHOS  --   --  2.6 1.1* 2.2* 2.4*   Liver Function Tests: Recent Labs  Lab 06/16/19 1049 06/17/19 0637 06/18/19 0342 06/19/19 0310 06/20/19 0737  AST 118* 90* 62* 57* 215*  ALT 65* 51* 45* 47* 150*  ALKPHOS 71 58 65 90 97  BILITOT 0.3 0.7 0.7 0.5 0.3  PROT 7.2 6.2* 6.4* 5.6* 6.1*  ALBUMIN 3.2* 2.8* 2.9* 2.4* 2.5*   No results for input(s): LIPASE, AMYLASE in the last 168 hours. No results for input(s): AMMONIA in  the last 168 hours. CBC: Recent Labs  Lab 06/16/19 1049 06/16/19 1140 06/17/19 0637 06/18/19 0342 06/19/19 0310 06/20/19 0737  WBC 6.2  --  7.5 8.3 7.7 6.7  NEUTROABS 5.2  --  6.5 7.5 6.8 5.6  HGB 16.2 15.6 14.1 13.5 13.4 13.6  HCT 54.7* 46.0 45.6 44.0 42.6  43.9  MCV 84.2  --  82.0 81.0 80.2 80.1  PLT 199  --  194 225 252 246   Cardiac Enzymes: No results for input(s): CKTOTAL, CKMB, CKMBINDEX, TROPONINI in the last 168 hours. BNP: BNP (last 3 results) No results for input(s): BNP in the last 8760 hours.  ProBNP (last 3 results) No results for input(s): PROBNP in the last 8760 hours.  CBG: Recent Labs  Lab 06/20/19 2132 06/21/19 0043 06/21/19 0455 06/21/19 0734 06/21/19 0825  GLUCAP 83 154* 231* 267* 258*       Signed:  Dia Crawford, MD [facility] [phone] pager

## 2019-06-21 NOTE — Progress Notes (Signed)
Phone report to Suezanne Cheshire RN at [facility] facility.

## 2019-06-22 ENCOUNTER — Emergency Department (HOSPITAL_COMMUNITY)
Admission: EM | Admit: 2019-06-22 | Discharge: 2019-06-22 | Disposition: A | Discharge status: Skilled Nursing Facility | Payer: Medicare Other | Attending: Emergency Medicine | Admitting: Emergency Medicine

## 2019-06-22 ENCOUNTER — Encounter (HOSPITAL_COMMUNITY): Payer: Self-pay

## 2019-06-22 ENCOUNTER — Other Ambulatory Visit: Payer: Self-pay

## 2019-06-22 ENCOUNTER — Emergency Department (HOSPITAL_COMMUNITY): Payer: Medicare Other

## 2019-06-22 DIAGNOSIS — U071 COVID-19: Secondary | ICD-10-CM

## 2019-06-22 LAB — CBC WITH DIFFERENTIAL/PLATELET
Abs Immature Granulocytes: 0.19 10*3/uL — ABNORMAL HIGH (ref 0.00–0.07)
Basophils Absolute: 0.1 10*3/uL (ref 0.0–0.1)
Basophils Relative: 1 %
Eosinophils Absolute: 0 10*3/uL (ref 0.0–0.5)
Eosinophils Relative: 0 %
HCT: 47.6 % (ref 39.0–52.0)
Hemoglobin: 14.8 g/dL (ref 13.0–17.0)
Immature Granulocytes: 2 %
Lymphocytes Relative: 13 %
Lymphs Abs: 1.3 10*3/uL (ref 0.7–4.0)
MCH: 25.2 pg — ABNORMAL LOW (ref 26.0–34.0)
MCHC: 31.1 g/dL (ref 30.0–36.0)
MCV: 81.1 fL (ref 80.0–100.0)
Monocytes Absolute: 0.7 10*3/uL (ref 0.1–1.0)
Monocytes Relative: 7 %
Neutro Abs: 7.4 10*3/uL (ref 1.7–7.7)
Neutrophils Relative %: 77 %
Platelets: 305 10*3/uL (ref 150–400)
RBC: 5.87 MIL/uL — ABNORMAL HIGH (ref 4.22–5.81)
RDW: 18.4 % — ABNORMAL HIGH (ref 11.5–15.5)
WBC: 9.5 10*3/uL (ref 4.0–10.5)
nRBC: 1.5 % — ABNORMAL HIGH (ref 0.0–0.2)

## 2019-06-22 LAB — COMPREHENSIVE METABOLIC PANEL
ALT: 99 U/L — ABNORMAL HIGH (ref 0–44)
AST: 74 U/L — ABNORMAL HIGH (ref 15–41)
Albumin: 2.5 g/dL — ABNORMAL LOW (ref 3.5–5.0)
Alkaline Phosphatase: 95 U/L (ref 38–126)
Anion gap: 9 (ref 5–15)
BUN: 28 mg/dL — ABNORMAL HIGH (ref 8–23)
CO2: 23 mmol/L (ref 22–32)
Calcium: 8.7 mg/dL — ABNORMAL LOW (ref 8.9–10.3)
Chloride: 114 mmol/L — ABNORMAL HIGH (ref 98–111)
Creatinine, Ser: 0.71 mg/dL (ref 0.61–1.24)
GFR calc Af Amer: >60 mL/min (ref >60)
GFR calc non Af Amer: >60 mL/min (ref >60)
Glucose, Bld: 217 mg/dL — ABNORMAL HIGH (ref 70–99)
Potassium: 4.7 mmol/L (ref 3.5–5.1)
Sodium: 146 mmol/L — ABNORMAL HIGH (ref 135–145)
Total Bilirubin: 0.5 mg/dL (ref 0.3–1.2)
Total Protein: 6.1 g/dL — ABNORMAL LOW (ref 6.5–8.1)

## 2019-06-22 LAB — TRIGLYCERIDES: Triglycerides: 91 mg/dL (ref <150)

## 2019-06-22 LAB — C-REACTIVE PROTEIN: CRP: 2.2 mg/dL — ABNORMAL HIGH (ref <1.0)

## 2019-06-22 LAB — D-DIMER, QUANTITATIVE: D-Dimer, Quant: 2.91 ug/mL-FEU — ABNORMAL HIGH (ref 0.00–0.50)

## 2019-06-22 LAB — LACTIC ACID, PLASMA
Lactic Acid, Venous: 2.4 mmol/L (ref 0.5–1.9)
Lactic Acid, Venous: 2.4 mmol/L (ref 0.5–1.9)

## 2019-06-22 LAB — PROCALCITONIN: Procalcitonin: 0.2 ng/mL

## 2019-06-22 LAB — FIBRINOGEN: Fibrinogen: 523 mg/dL — ABNORMAL HIGH (ref 210–475)

## 2019-06-22 LAB — LACTATE DEHYDROGENASE: LDH: 447 U/L — ABNORMAL HIGH (ref 98–192)

## 2019-06-22 LAB — FERRITIN: Ferritin: 265 ng/mL (ref 24–336)

## 2019-06-22 MED ORDER — SODIUM CHLORIDE 0.9 % IV BOLUS
250.0000 mL | Freq: Once | INTRAVENOUS | Status: AC
  Administered 2019-06-22: 04:00:00 250 mL via INTRAVENOUS

## 2019-06-22 NOTE — ED Triage Notes (Signed)
Per EMS, pt from [facility] and tested positive for covid on 12/21. Staff called EMS due to patient's decreasing o2 stats. Staff stated that patient normally uses 4L/min via [place], but was satting at 88% when EMS arrived. EMS put patient on NRB at 8L/min, the highest o2 went up was 96%, but has been around 94% for most of the transport. Rhonchi heard bilaterally in all lung fields. Per EMS, patient's arms are contracted inwards and his right arm tends to push off his NRB. Patient is non-verbal and A&Ox1 at baseline.

## 2019-06-22 NOTE — ED Notes (Signed)
Pt provided with peri care and wound care upon arriving to ED soiled in his urine. Pt also had booties on his feet that were also soiled in urine. Pt provided with condom cath due to being incontinent, and mepalex pads for his wounds. Pt repositioned and turned to get off of his bottom. Pt has call bell within reach.

## 2019-06-22 NOTE — ED Notes (Addendum)
CRITICAL VALUE STICKER  CRITICAL VALUE: 2.4 Lactic Acid   RECEIVER (on-site recipient of call): Rolm Gala. RN   DATE & TIME NOTIFIED: [phone]  MESSENGER (representative from lab): Chapman Fitch   MD NOTIFIED: Dr. Wilkie Aye  TIME OF NOTIFICATION: 3568  RESPONSE: normal saline 250 mL bolus

## 2019-06-22 NOTE — ED Notes (Signed)
IV team at bedside 

## 2019-06-22 NOTE — ED Provider Notes (Signed)
[facility]-EMERGENCY DEPT Provider Note   CSN: [id number] Arrival date & time: 06/21/19  2359     History No chief complaint on file.   Billy Nelson is a 64 y.o. male.  HPI     This is a 64 year old male with a history of quadriplegia, intracranial hemorrhage, diabetes and recent admission for COVID-19 who presents with desaturations from his living facility.  He tested positive for COVID-19 originally on December 21.  He was admitted to the hospital on December 28 and discharged yesterday morning.  At that time he was discharged on 3 L nasal cannula.  Per EMS staff noted that he was desaturating.  He had O2 sats of 88% on EMS arrival on 4 L.  He was transitioned to nonrebreather with increasing O2 sats.  No other reported complaints.  Patient is unable to contribute to history taking as he is nonverbal.  Level 5 caveat.  Past Medical History:  Diagnosis Date  . COPD (chronic obstructive pulmonary disease) (HCC)   . Dysphagia     Patient Active Problem List   Diagnosis Date Noted  . Pressure injury of skin 06/17/2019  . Pneumonia due to COVID-19 virus 06/17/2019  . Diabetes mellitus type 2, controlled, with complications (HCC) 06/17/2019  . Quadriplegia (HCC) 06/17/2019  . History of intracranial hemorrhage 06/17/2019  . Essential hypertension 06/17/2019  . Acute respiratory failure with hypoxia (HCC) 06/17/2019  . Bacterial pneumonia 06/17/2019  . Hypernatremia 06/17/2019  . Pressure ulcer 06/17/2019  . Acute respiratory failure due to COVID-19 [facility]) 06/16/2019    History reviewed. No pertinent surgical history.     No family history on file.  Social History   Tobacco Use  . Smoking status: Never Smoker  . Smokeless tobacco: Never Used  Substance Use Topics  . Alcohol use: Never  . Drug use: Never    Home Medications Prior to Admission medications   Medication Sig Start Date End Date Taking? Authorizing Provider  acetaminophen  (TYLENOL) 325 MG tablet Place 650 mg into feeding tube every 4 (four) hours as needed for fever.    [provider]  apixaban (ELIQUIS) 2.5 MG TABS tablet Take 2.5 mg by mouth 2 (two) times daily. For 21 days 06/13/19   [provider]  ascorbic acid (VITAMIN C) 500 MG tablet Place 1 tablet (500 mg total) into feeding tube daily. 06/21/19   Drema Dallas, MD  aspirin 81 MG chewable tablet Place 1 tablet (81 mg total) into feeding tube daily. 06/21/19   Drema Dallas, MD  Cholecalciferol (VITAMIN D) 50 MCG (2000 UT) CAPS Take 2,000 Units by mouth daily.    [provider]  dexamethasone (DECADRON) 10 MG/ML injection Inject 0.6 mLs (6 mg total) into the vein daily. 06/21/19   Drema Dallas, MD  insulin aspart (NOVOLOG FLEXPEN) 100 UNIT/ML FlexPen Inject 15 Units into the skin every 4 (four) hours. 06/21/19   Drema Dallas, MD  insulin detemir (LEVEMIR) 100 unit/ml SOLN Inject 0.25 mLs (25 Units total) into the skin daily. 06/21/19   Drema Dallas, MD  Insulin Pen Needle (PEN NEEDLES 29GX1/2") 29G X MISC Use as directed 06/21/19   Drema Dallas, MD  Ipratropium-Albuterol (COMBIVENT) 20-100 MCG/ACT AERS respimat Inhale 1 puff into the lungs 4 (four) times daily. 06/21/19   Drema Dallas, MD  Nutritional Supplements (ISOSOURCE 1.5 CAL PO) by PEG Tube route. 30ml/hr  50 cc water flush. Given at 1800 and turn off  at 0800.    [provider]  pantoprazole sodium (PROTONIX) 40 mg/20 mL PACK Place 20 mLs (40 mg total) into feeding tube at bedtime. 06/21/19   Drema Dallas, MD  Water For Irrigation, Sterile (FREE WATER) SOLN Place 400 mLs into feeding tube 4 (four) times daily. 06/21/19   Drema Dallas, MD  zinc oxide (BALMEX) 11.3 % CREA cream Apply 1 application topically 2 (two) times daily as needed. Apply to buttocks for redness or irritation    [provider]  zinc sulfate 220 (50 Zn) MG capsule Place 1 capsule (220 mg total) into feeding tube daily. 06/21/19    Drema Dallas, MD    Allergies    Patient has no known allergies.  Review of Systems   Review of Systems  Unable to perform ROS: Patient nonverbal    Physical Exam Updated Vital Signs BP (!) 130/99   Pulse (!) 55   Temp 98.5 F (36.9 C) (Oral)   Resp (!) 24   Ht 1.829 m (6')   Wt 81.6 kg   SpO2 96% Comment: Weaned o2 to 2L/min  BMI 24.41 kg/m   Physical Exam Vitals and nursing note reviewed.  Constitutional:      Appearance: He is well-developed.     Comments: Chronically ill-appearing, no acute distress  HENT:     Head: Normocephalic and atraumatic.     Mouth/Throat:     Mouth: Mucous membranes are dry.  Eyes:     Pupils: Pupils are equal, round, and reactive to light.     Comments: Does not track  Cardiovascular:     Rate and Rhythm: Normal rate and regular rhythm.     Heart sounds: Normal heart sounds.  Pulmonary:     Effort: Pulmonary effort is normal. No respiratory distress.     Breath sounds: Normal breath sounds. No wheezing.     Comments: Coarse breath sounds bilaterally, no significant tachypnea or respiratory distress noted Abdominal:     General: Bowel sounds are normal.     Palpations: Abdomen is soft.     Tenderness: There is no abdominal tenderness. There is no rebound.  Genitourinary:    Comments: Foley catheter in place Musculoskeletal:     Cervical back: Neck supple.     Comments: All 4 extremities contractured, 1+ bilateral lower extremity edema  Skin:    General: Skin is warm and dry.     Comments: Multiple blisters noted on the bilateral lower extremities some intact, range in size from 1cm to 5 cm, negative Nikolsky's, no significant erythema Multiple pressure ulcers noted, see nursing notes for details  Neurological:     Mental Status: He is alert.     Comments: Nonverbal, unable to assess orientation, contractured with minimal movement  Psychiatric:     Comments: Unable to assess     ED Results / Procedures / Treatments     Labs (all labs ordered are listed, but only abnormal results are displayed) Labs Reviewed  LACTIC ACID, PLASMA - Abnormal; Notable for the following components:      Result Value   Lactic Acid, Venous 2.4 (*)    All other components within normal limits  CBC WITH DIFFERENTIAL/PLATELET - Abnormal; Notable for the following components:   RBC 5.87 (*)    MCH 25.2 (*)    RDW 18.4 (*)    nRBC 1.5 (*)    Abs Immature Granulocytes 0.19 (*)    All other components within normal limits  COMPREHENSIVE METABOLIC PANEL - Abnormal; Notable for the following components:   Sodium 146 (*)    Chloride 114 (*)    Glucose, Bld 217 (*)    BUN 28 (*)    Calcium 8.7 (*)    Total Protein 6.1 (*)    Albumin 2.5 (*)    AST 74 (*)    ALT 99 (*)    All other components within normal limits  D-DIMER, QUANTITATIVE (NOT AT [facility]) - Abnormal; Notable for the following components:   D-Dimer, Quant 2.91 (*)    All other components within normal limits  LACTATE DEHYDROGENASE - Abnormal; Notable for the following components:   LDH 447 (*)    All other components within normal limits  FIBRINOGEN - Abnormal; Notable for the following components:   Fibrinogen 523 (*)    All other components within normal limits  C-REACTIVE PROTEIN - Abnormal; Notable for the following components:   CRP 2.2 (*)    All other components within normal limits  CULTURE, BLOOD (ROUTINE X 2)  CULTURE, BLOOD (ROUTINE X 2)  PROCALCITONIN  FERRITIN  TRIGLYCERIDES  LACTIC ACID, PLASMA    EKG EKG Interpretation  Date/Time:  Sunday June 22 2019 00:53:01 EST Ventricular Rate:  79 PR Interval:    QRS Duration: 103 QT Interval:  430 QTC Calculation: 493 R Axis:   -42 Text Interpretation: Sinus rhythm Left anterior fascicular block Abnormal R-wave progression, early transition Abnormal T, consider ischemia, diffuse leads Slower than prior Confirmed by Thayer Jew [phone]) on 06/22/2019 1:08:45 AM   Radiology DG Chest Port 1  View  Result Date: 06/22/2019 CLINICAL DATA:  Initial evaluation for acute COVID infection. EXAM: PORTABLE CHEST 1 VIEW COMPARISON:  Prior radiograph from 06/19/2019. FINDINGS: Mild cardiomegaly, stable. Mediastinal silhouette within normal limits. Lungs hypoinflated. Prominent bilateral interstitial airspace opacities, presumably related to history of viral pneumonitis, mildly improved as compared to previous. No frank pulmonary edema or pleural effusion. No consolidative infiltrate. No pneumothorax. No acute osseous abnormality. IMPRESSION: Prominent bilateral interstitial opacities, presumably related to history of COVID infection. Overall, appearance is mildly improved from previous. Electronically Signed   By: Jeannine Boga M.D.   On: 06/22/2019 01:12    Procedures Procedures (including critical care time)  CRITICAL CARE Performed by: Merryl Hacker   Total critical care time: 35 minutes  Critical care time was exclusive of separately billable procedures and treating other patients.  Critical care was necessary to treat or prevent imminent or life-threatening deterioration.  Critical care was time spent personally by me on the following activities: development of treatment plan with patient and/or surrogate as well as nursing, discussions with consultants, evaluation of patient's response to treatment, examination of patient, obtaining history from patient or surrogate, ordering and performing treatments and interventions, ordering and review of laboratory studies, ordering and review of radiographic studies, pulse oximetry and re-evaluation of patient's condition.   Medications Ordered in ED Medications  sodium chloride 0.9 % bolus 250 mL (250 mLs Intravenous New Bag/Given 06/22/19 0356)    ED Course  I have reviewed the triage vital signs and the nursing notes.  Pertinent labs & imaging results that were available during my care of the patient were reviewed by me and  considered in my medical decision making (see chart for details).    MDM Rules/Calculators/A&P                      Patient presents with concerns for worsening hypoxia.  Recent COVID-19 diagnosis.  He was discharged from the hospital yesterday morning on oxygen.  He was escalated on oxygen by EMS to nonrebreather.  He is chronically ill-appearing.  He does not appear to be in any respiratory distress.  I have repeated Covid lab work.  Chest x-ray does not show any worsening pneumonia.  EKG without ischemic changes or arrhythmia.  We were able to wean his oxygen down to 3 L without any ill effects.  Overall his lab work appears to be trending in the right direction.  I feel he can be safely discharged back to his living facility with continued oxygen and instructions for titration as needed.  GINA COSTILLA was evaluated in Emergency Department on 06/22/2019 for the symptoms described in the history of present illness. He was evaluated in the context of the global COVID-19 pandemic, which necessitated consideration that the patient might be at risk for infection with the SARS-CoV-2 virus that causes COVID-19. Institutional protocols and algorithms that pertain to the evaluation of patients at risk for COVID-19 are in a state of rapid change based on information released by regulatory bodies including the CDC and federal and state organizations. These policies and algorithms were followed during the patient's care in the ED.     Final Clinical Impression(s) / ED Diagnoses Final diagnoses:  COVID-19    Rx / DC Orders ED Discharge Orders    None       Dimetrius Montfort, Mayer Masker, MD 06/22/19 [phone]

## 2019-06-22 NOTE — Discharge Instructions (Addendum)
Patient was seen today for concerns with hypoxia.  His labs continue to improve.  He has no new pneumonia on his chest x-ray.  He was weaned down to 3 L of oxygen.  Titrate oxygen as needed.

## 2019-06-22 NOTE — ED Notes (Signed)
[facility] contacted for transportation back to [facility]. Maple Lucas Mallow made aware of patient's discharge. Discharge and transportation paperwork completed and placed in patient's bin.

## 2019-06-23 ENCOUNTER — Other Ambulatory Visit: Payer: Self-pay

## 2019-06-23 ENCOUNTER — Emergency Department (HOSPITAL_COMMUNITY)
Admission: EM | Admit: 2019-06-23 | Discharge: 2019-06-23 | Disposition: A | Discharge status: Home / Self Care | Payer: Medicare Other | Attending: Emergency Medicine | Admitting: Emergency Medicine

## 2019-06-23 ENCOUNTER — Emergency Department (HOSPITAL_COMMUNITY): Payer: Medicare Other

## 2019-06-23 ENCOUNTER — Encounter (HOSPITAL_COMMUNITY): Payer: Self-pay | Admitting: Emergency Medicine

## 2019-06-23 DIAGNOSIS — Z794 Long term (current) use of insulin: Secondary | ICD-10-CM | POA: Diagnosis not present

## 2019-06-23 DIAGNOSIS — J449 Chronic obstructive pulmonary disease, unspecified: Secondary | ICD-10-CM | POA: Diagnosis not present

## 2019-06-23 DIAGNOSIS — U071 COVID-19: Secondary | ICD-10-CM | POA: Insufficient documentation

## 2019-06-23 DIAGNOSIS — E119 Type 2 diabetes mellitus without complications: Secondary | ICD-10-CM | POA: Insufficient documentation

## 2019-06-23 DIAGNOSIS — Z7982 Long term (current) use of aspirin: Secondary | ICD-10-CM | POA: Diagnosis not present

## 2019-06-23 DIAGNOSIS — Z7901 Long term (current) use of anticoagulants: Secondary | ICD-10-CM | POA: Diagnosis not present

## 2019-06-23 DIAGNOSIS — R0902 Hypoxemia: Secondary | ICD-10-CM | POA: Diagnosis present

## 2019-06-23 DIAGNOSIS — I10 Essential (primary) hypertension: Secondary | ICD-10-CM | POA: Diagnosis not present

## 2019-06-23 HISTORY — DX: Quadriplegia, unspecified: G82.50

## 2019-06-23 NOTE — ED Notes (Signed)
Report called to [facility], [facility] called for transport

## 2019-06-23 NOTE — ED Triage Notes (Signed)
Pt transported from [facility] by [facility] for reported low oxygen saturation. Pt has recent hx of COVID with complete ER workup yesterday. Pt is at baseline neuro wise, nonverbal, per EMS staff reports pt desated to 88% on 4L, pt placed on simple mask and quickly went up to 96%.  Staff reports they have orders to sent patient to ED for sat lower than 93%, pt had reported temp of 99, APAP 1000mg  given via feeding tube by staff.  Pt placed back on 4L Jal on ER arrival, sats maintaining 95-96%

## 2019-06-23 NOTE — Discharge Instructions (Signed)
Continue current care.

## 2019-06-23 NOTE — ED Provider Notes (Signed)
[facility] EMERGENCY DEPARTMENT Provider Note   CSN: [id number] Arrival date & time: 06/23/19  0321   History Chief Complaint  Patient presents with  . COVID    Billy Nelson is a 64 y.o. male.  The history is provided by the nursing home. The history is limited by the condition of the patient (Patient is nonverbal).  He has history of hypertension, diabetes, COPD, COVID-19 and was sent from his skilled nursing facility because oxygen saturation dropped to 88% and they have a standing order to send him to the emergency department if oxygen saturation drops below 93%.  He is maintained on oxygen at 4 L/min.  He is reported to have had a temperature of 99 degrees and was given acetaminophen.  Past Medical History:  Diagnosis Date  . COPD (chronic obstructive pulmonary disease) (HCC)   . Dysphagia     Patient Active Problem List   Diagnosis Date Noted  . Pressure injury of skin 06/17/2019  . Pneumonia due to COVID-19 virus 06/17/2019  . Diabetes mellitus type 2, controlled, with complications (HCC) 06/17/2019  . Quadriplegia (HCC) 06/17/2019  . History of intracranial hemorrhage 06/17/2019  . Essential hypertension 06/17/2019  . Acute respiratory failure with hypoxia (HCC) 06/17/2019  . Bacterial pneumonia 06/17/2019  . Hypernatremia 06/17/2019  . Pressure ulcer 06/17/2019  . Acute respiratory failure due to COVID-19 [facility]) 06/16/2019    No past surgical history on file.     No family history on file.  Social History   Tobacco Use  . Smoking status: Never Smoker  . Smokeless tobacco: Never Used  Substance Use Topics  . Alcohol use: Never  . Drug use: Never    Home Medications Prior to Admission medications   Medication Sig Start Date End Date Taking? Authorizing Provider  acetaminophen (TYLENOL) 325 MG tablet Place 650 mg into feeding tube every 4 (four) hours as needed for fever.    [provider]  apixaban (ELIQUIS) 2.5 MG TABS  tablet Take 2.5 mg by mouth 2 (two) times daily. For 21 days 06/13/19   [provider]  ascorbic acid (VITAMIN C) 500 MG tablet Place 1 tablet (500 mg total) into feeding tube daily. 06/21/19   Drema Dallas, MD  aspirin 81 MG chewable tablet Place 1 tablet (81 mg total) into feeding tube daily. 06/21/19   Drema Dallas, MD  Cholecalciferol (VITAMIN D) 50 MCG (2000 UT) CAPS Take 2,000 Units by mouth daily.    [provider]  dexamethasone (DECADRON) 10 MG/ML injection Inject 0.6 mLs (6 mg total) into the vein daily. 06/21/19   Drema Dallas, MD  insulin aspart (NOVOLOG FLEXPEN) 100 UNIT/ML FlexPen Inject 15 Units into the skin every 4 (four) hours. 06/21/19   Drema Dallas, MD  insulin detemir (LEVEMIR) 100 unit/ml SOLN Inject 0.25 mLs (25 Units total) into the skin daily. 06/21/19   Drema Dallas, MD  Insulin Pen Needle (PEN NEEDLES 29GX1/2") 29G X MISC Use as directed 06/21/19   Drema Dallas, MD  Ipratropium-Albuterol (COMBIVENT) 20-100 MCG/ACT AERS respimat Inhale 1 puff into the lungs 4 (four) times daily. 06/21/19   Drema Dallas, MD  Nutritional Supplements (ISOSOURCE 1.5 CAL PO) by PEG Tube route. 33ml/hr  50 cc water flush. Given at 1800 and turn off at 0800.    [provider]  pantoprazole sodium (PROTONIX) 40 mg/20 mL PACK Place 20 mLs (40 mg total) into feeding tube at bedtime. 06/21/19  Drema Dallas, MD  Water For Irrigation, Sterile (FREE WATER) SOLN Place 400 mLs into feeding tube 4 (four) times daily. 06/21/19   Drema Dallas, MD  zinc oxide (BALMEX) 11.3 % CREA cream Apply 1 application topically 2 (two) times daily as needed. Apply to buttocks for redness or irritation    [provider]  zinc sulfate 220 (50 Zn) MG capsule Place 1 capsule (220 mg total) into feeding tube daily. 06/21/19   Drema Dallas, MD    Allergies    Patient has no known allergies.  Review of Systems   Review of Systems  Unable to perform ROS: Patient  nonverbal    Physical Exam Updated Vital Signs BP 109/76 (BP Location: Left Arm)   Pulse 81   Temp 97.8 F (36.6 C) (Tympanic)   Resp 20   Wt 81 kg   SpO2 95%   BMI 24.22 kg/m   Physical Exam Vitals and nursing note reviewed.   64 year old male, resting comfortably and in no acute distress. Vital signs are normal. Oxygen saturation is 95%, which is normal. Head is normocephalic and atraumatic. PERRLA. Oropharynx is clear. Neck is nontender and supple without adenopathy or JVD. Back is nontender and there is no CVA tenderness. Lungs are clear without rales, wheezes, or rhonchi. Chest is nontender. Heart has regular rate and rhythm without murmur. Abdomen is soft, flat, nontender without masses or hepatosplenomegaly and peristalsis is normoactive. Extremities have no cyanosis or edema, full range of motion is present. Skin is warm and dry without rash.  Bulla is noted in the left popliteal area.  Numerous areas of desquamation -apparently from where other bullae had broken. Neurologic: Awake and responds to pain but is nonverbal and will not follow commands, cranial nerves are intact.  He is quadriplegic.  ED Results / Procedures / Treatments    EKG EKG Interpretation  Date/Time:  Monday June 23 2019 03:22:20 EST Ventricular Rate:  82 PR Interval:    QRS Duration: 83 QT Interval:  399 QTC Calculation: 466 R Axis:   -45 Text Interpretation: Sinus rhythm Left anterior fascicular block Abnormal R-wave progression, early transition Abnormal T, consider ischemia, diffuse leads When compared with ECG of 06/21/2018, No significant change was found Confirmed by Dione Booze ([id number]) on 06/23/2019 3:27:01 AM   Radiology DG Chest Port 1 View  Result Date: 06/23/2019 CLINICAL DATA:  Hypoxia. Recent COVID diagnosis. EXAM: PORTABLE CHEST 1 VIEW COMPARISON:  Most recent radiograph yesterday FINDINGS: Bilateral heterogeneous pulmonary opacities, not significantly changed from exam  yesterday. Unchanged heart size and mediastinal contours. No pleural effusion or pneumothorax. Unchanged osseous structures. IMPRESSION: Unchanged appearance of bilateral multifocal COVID pneumonia since yesterday. Electronically Signed   By: Narda Rutherford M.D.   On: 06/23/2019 04:18   DG Chest Port 1 View  Result Date: 06/22/2019 CLINICAL DATA:  Initial evaluation for acute COVID infection. EXAM: PORTABLE CHEST 1 VIEW COMPARISON:  Prior radiograph from 06/19/2019. FINDINGS: Mild cardiomegaly, stable. Mediastinal silhouette within normal limits. Lungs hypoinflated. Prominent bilateral interstitial airspace opacities, presumably related to history of viral pneumonitis, mildly improved as compared to previous. No frank pulmonary edema or pleural effusion. No consolidative infiltrate. No pneumothorax. No acute osseous abnormality. IMPRESSION: Prominent bilateral interstitial opacities, presumably related to history of COVID infection. Overall, appearance is mildly improved from previous. Electronically Signed   By: Rise Mu M.D.   On: 06/22/2019 01:12    Procedures Procedures   Medications Ordered in ED Medications - No  data to display  ED Course  I have reviewed the triage vital signs and the nursing notes.  Pertinent imaging results that were available during my care of the patient were reviewed by me and considered in my medical decision making (see chart for details).  MDM Rules/Calculators/A&P Patient known to be COVID-19 positive presents with episode of oxygen desaturation at the skilled nursing center.  While here, oxygen saturation is adequate on the same oxygen regimen that he is supposed to be on.  I suspect either the oxygen saturation at the skilled nursing facility was factitious, or he may have had a mucous plug which has cleared.  No indication for blood testing, will check chest x-ray to see if there has been any change.  Old records are reviewed confirming Covid 19  diagnosed on 12/21, admitted 12/28 through 1/2 for respiratory failure secondary to COVID-19, ED visit yesterday for oxygen desaturation with extensive work-up being negative.  ECG is unchanged from yesterday, chest x-ray is unchanged from yesterday.  Patient was observed in the ED for over an hour without any oxygen desaturation.  He is felt to be safe for discharge back to his skilled nursing facility.  Billy Nelson was evaluated in Emergency Department on 06/23/2019 for the symptoms described in the history of present illness. He was evaluated in the context of the global COVID-19 pandemic, which necessitated consideration that the patient might be at risk for infection with the SARS-CoV-2 virus that causes COVID-19. Institutional protocols and algorithms that pertain to the evaluation of patients at risk for COVID-19 are in a state of rapid change based on information released by regulatory bodies including the CDC and federal and state organizations. These policies and algorithms were followed during the patient's care in the ED.  Final Clinical Impression(s) / ED Diagnoses Final diagnoses:  COVID-19 virus infection    Rx / DC Orders ED Discharge Orders    None       Delora Fuel, MD 61/44/31 [phone]

## 2019-06-27 LAB — CULTURE, BLOOD (ROUTINE X 2)
Culture: NO GROWTH
Culture: NO GROWTH
Special Requests: ADEQUATE
Special Requests: ADEQUATE

## 2019-06-28 ENCOUNTER — Emergency Department (HOSPITAL_COMMUNITY)
Admission: EM | Admit: 2019-06-28 | Discharge: 2019-06-28 | Disposition: A | Discharge status: Home / Self Care | Payer: Medicare Other | Attending: Emergency Medicine | Admitting: Emergency Medicine

## 2019-06-28 ENCOUNTER — Emergency Department (HOSPITAL_COMMUNITY): Payer: Medicare Other

## 2019-06-28 DIAGNOSIS — Z8616 Personal history of COVID-19: Secondary | ICD-10-CM | POA: Diagnosis not present

## 2019-06-28 DIAGNOSIS — Z7982 Long term (current) use of aspirin: Secondary | ICD-10-CM | POA: Insufficient documentation

## 2019-06-28 DIAGNOSIS — Z79899 Other long term (current) drug therapy: Secondary | ICD-10-CM | POA: Insufficient documentation

## 2019-06-28 DIAGNOSIS — F0390 Unspecified dementia without behavioral disturbance: Secondary | ICD-10-CM | POA: Insufficient documentation

## 2019-06-28 DIAGNOSIS — E119 Type 2 diabetes mellitus without complications: Secondary | ICD-10-CM | POA: Insufficient documentation

## 2019-06-28 DIAGNOSIS — K9429 Other complications of gastrostomy: Secondary | ICD-10-CM | POA: Insufficient documentation

## 2019-06-28 DIAGNOSIS — J449 Chronic obstructive pulmonary disease, unspecified: Secondary | ICD-10-CM | POA: Insufficient documentation

## 2019-06-28 DIAGNOSIS — Z931 Gastrostomy status: Secondary | ICD-10-CM

## 2019-06-28 DIAGNOSIS — I10 Essential (primary) hypertension: Secondary | ICD-10-CM | POA: Insufficient documentation

## 2019-06-28 DIAGNOSIS — Z794 Long term (current) use of insulin: Secondary | ICD-10-CM | POA: Diagnosis not present

## 2019-06-28 MED ORDER — IOHEXOL 300 MG/ML  SOLN: 50.0000 mL | Freq: Once | INTRAMUSCULAR | Status: DC | PRN

## 2019-06-28 NOTE — ED Provider Notes (Signed)
[facility] DEPT Provider Note   CSN: [id number] Arrival date & time: 06/28/19  0748     History Chief Complaint  Patient presents with  . GI tube clogged    Billy Nelson is a 64 y.o. male.  64 year old male who presents for nursing home with feeding tube being clogged.  Patient recently Covid positive.  Patient is noncommunicative at baseline.  Is chronically on oxygen at 4 L.  No other complaints at this time        Past Medical History:  Diagnosis Date  . COPD (chronic obstructive pulmonary disease) ([place])   . Dysphagia   . Quadriplegia [facility])     Patient Active Problem List   Diagnosis Date Noted  . Pressure injury of skin 06/17/2019  . Pneumonia due to COVID-19 virus 06/17/2019  . Diabetes mellitus type 2, controlled, with complications ([place]) 53/66/4403  . Quadriplegia ([place]) 06/17/2019  . History of intracranial hemorrhage 06/17/2019  . Essential hypertension 06/17/2019  . Acute respiratory failure with hypoxia ([place]) 06/17/2019  . Bacterial pneumonia 06/17/2019  . Hypernatremia 06/17/2019  . Pressure ulcer 06/17/2019  . Acute respiratory failure due to COVID-19 [facility]) 06/16/2019    No past surgical history on file.     No family history on file.  Social History   Tobacco Use  . Smoking status: Never Smoker  . Smokeless tobacco: Never Used  Substance Use Topics  . Alcohol use: Not Currently  . Drug use: Not Currently    Home Medications Prior to Admission medications   Medication Sig Start Date End Date Taking? Authorizing Provider  acetaminophen (TYLENOL) 325 MG tablet Place 650 mg into feeding tube every 4 (four) hours as needed for fever.    [provider]  apixaban (ELIQUIS) 2.5 MG TABS tablet Take 2.5 mg by mouth 2 (two) times daily. For 21 days 06/13/19   [provider]  ascorbic acid (VITAMIN C) 500 MG tablet Place 1 tablet (500 mg total) into feeding tube daily. 06/21/19   Allie Bossier, MD    aspirin 81 MG chewable tablet Place 1 tablet (81 mg total) into feeding tube daily. 06/21/19   Allie Bossier, MD  Cholecalciferol (VITAMIN D) 50 MCG (2000 UT) CAPS Take 2,000 Units by mouth daily.    [provider]  dexamethasone (DECADRON) 10 MG/ML injection Inject 0.6 mLs (6 mg total) into the vein daily. 06/21/19   Allie Bossier, MD  insulin aspart (NOVOLOG FLEXPEN) 100 UNIT/ML FlexPen Inject 15 Units into the skin every 4 (four) hours. 06/21/19   Allie Bossier, MD  insulin detemir (LEVEMIR) 100 unit/ml SOLN Inject 0.25 mLs (25 Units total) into the skin daily. 06/21/19   Allie Bossier, MD  Insulin Pen Needle (PEN NEEDLES 29GX1/2") 29G X 12MM MISC Use as directed 06/21/19   Allie Bossier, MD  Ipratropium-Albuterol (COMBIVENT) 20-100 MCG/ACT AERS respimat Inhale 1 puff into the lungs 4 (four) times daily. 06/21/19   Allie Bossier, MD  Nutritional Supplements (ISOSOURCE 1.5 CAL PO) by PEG Tube route. 60ml/hr  50 cc water flush. Given at 1800 and turn off at 0800.    [provider]  pantoprazole sodium (PROTONIX) 40 mg/20 mL PACK Place 20 mLs (40 mg total) into feeding tube at bedtime. 06/21/19   Allie Bossier, MD  Water For Irrigation, Sterile (FREE WATER) SOLN Place 400 mLs into feeding tube 4 (four) times daily. 06/21/19   Allie Bossier, MD  zinc oxide (  BALMEX) 11.3 % CREA cream Apply 1 application topically 2 (two) times daily as needed. Apply to buttocks for redness or irritation    [provider]  zinc sulfate 220 (50 Zn) MG capsule Place 1 capsule (220 mg total) into feeding tube daily. 06/21/19   Drema Dallas, MD    Allergies    Patient has no known allergies.  Review of Systems   Review of Systems  Unable to perform ROS: Dementia    Physical Exam Updated Vital Signs BP (!) 114/97 (BP Location: Left Arm)   Pulse (!) 106   Temp 98.3 F (36.8 C) (Oral)   Resp 17   SpO2 96%   Physical Exam Vitals and nursing note reviewed.  Constitutional:       General: He is not in acute distress.    Appearance: He is cachectic. He is not toxic-appearing.  HENT:     Head: Normocephalic and atraumatic.  Eyes:     General: Lids are normal.     Conjunctiva/sclera: Conjunctivae normal.     Pupils: Pupils are equal, round, and reactive to light.  Neck:     Thyroid: No thyroid mass.     Trachea: No tracheal deviation.  Cardiovascular:     Rate and Rhythm: Regular rhythm. Tachycardia present.     Heart sounds: Normal heart sounds. No murmur. No gallop.   Pulmonary:     Effort: Pulmonary effort is normal. No respiratory distress.     Breath sounds: Normal breath sounds. No stridor. No decreased breath sounds, wheezing, rhonchi or rales.  Abdominal:     General: Bowel sounds are normal. There is no distension.     Palpations: Abdomen is soft.     Tenderness: There is no abdominal tenderness. There is no rebound.    Musculoskeletal:        General: No tenderness. Normal range of motion.     Cervical back: Normal range of motion and neck supple.  Skin:    General: Skin is warm and dry.     Findings: No abrasion or rash.  Neurological:     Mental Status: He is alert. Mental status is at baseline.     ED Results / Procedures / Treatments   Labs (all labs ordered are listed, but only abnormal results are displayed) Labs Reviewed - No data to display  EKG None  Radiology No results found.  Procedures Procedures (including critical care time)  Medications Ordered in ED Medications - No data to display  ED Course  I have reviewed the triage vital signs and the nursing notes.  Pertinent labs & imaging results that were available during my care of the patient were reviewed by me and considered in my medical decision making (see chart for details).    MDM Rules/Calculators/A&P                      Patient is feeding tube was cleaned out using a stylette.  Placement confirmed with Gastrografin and will send back to nursing  home Final Clinical Impression(s) / ED Diagnoses Final diagnoses:  None    Rx / DC Orders ED Discharge Orders    None       Lorre Nick, MD 06/28/19 [phone]

## 2019-06-28 NOTE — Discharge Instructions (Addendum)
The patient is G-tube is functioning now

## 2019-06-28 NOTE — ED Triage Notes (Signed)
Transported by [facility] from [facility]-- clogged G tube. Patient is non verbal at baseline. Tested positive for COVID on 12/28. Oxygen dependent at 4 L [place].

## 2019-06-28 NOTE — ED Notes (Signed)
X-ray at bedside

## 2019-06-28 NOTE — ED Notes (Signed)
[facility] called for transport back to [place] .  

## 2019-06-28 NOTE — ED Notes (Signed)
MD cleared Gtube with Gtube clearing device and 15cc of sterile water was able to pass through tube. Pt comfortable and stable.

## 2019-07-01 ENCOUNTER — Emergency Department (HOSPITAL_COMMUNITY): Payer: Medicare Other

## 2019-07-01 ENCOUNTER — Other Ambulatory Visit: Payer: Self-pay

## 2019-07-01 ENCOUNTER — Emergency Department (HOSPITAL_COMMUNITY)
Admission: EM | Admit: 2019-07-01 | Discharge: 2019-07-02 | Disposition: A | Discharge status: Home / Self Care | Payer: Medicare Other | Attending: Emergency Medicine | Admitting: Emergency Medicine

## 2019-07-01 ENCOUNTER — Encounter (HOSPITAL_COMMUNITY): Payer: Self-pay

## 2019-07-01 DIAGNOSIS — J441 Chronic obstructive pulmonary disease with (acute) exacerbation: Secondary | ICD-10-CM | POA: Diagnosis not present

## 2019-07-01 DIAGNOSIS — U071 COVID-19: Secondary | ICD-10-CM | POA: Diagnosis not present

## 2019-07-01 DIAGNOSIS — R0603 Acute respiratory distress: Secondary | ICD-10-CM | POA: Diagnosis present

## 2019-07-01 MED ORDER — ALBUTEROL SULFATE HFA 108 (90 BASE) MCG/ACT IN AERS
6.0000 | INHALATION_SPRAY | Freq: Once | RESPIRATORY_TRACT | Status: AC
  Administered 2019-07-01: 6 via RESPIRATORY_TRACT
  Filled 2019-07-01: qty 6.7

## 2019-07-01 MED ORDER — AEROCHAMBER PLUS FLO-VU LARGE MISC
1.0000 | Freq: Once | Status: AC
  Administered 2019-07-01: 1

## 2019-07-01 MED ORDER — SODIUM CHLORIDE 0.9 % IV SOLN: 1000.0000 mL | INTRAVENOUS | Status: DC

## 2019-07-01 MED ORDER — SODIUM CHLORIDE 0.9 % IV SOLN
1000.0000 mL | INTRAVENOUS | Status: DC
  Administered 2019-07-02: 1000 mL via INTRAVENOUS

## 2019-07-01 MED ORDER — SODIUM CHLORIDE 0.9% FLUSH: 10.0000 mL | INTRAVENOUS | Status: DC | PRN

## 2019-07-01 NOTE — ED Notes (Signed)
RT Tiffany consulted to obtain face mask for albuterol inhaler treatment

## 2019-07-01 NOTE — ED Notes (Signed)
IV team at bedside 

## 2019-07-01 NOTE — ED Triage Notes (Signed)
Pt BIB [facility] for respiratory distress. Pt non-verbal at baseline. Tested COVID positive 12/28. VSS en route

## 2019-07-01 NOTE — ED Notes (Signed)
This RN attempted a peripheral IV twice, unsuccessful with both attempts. IV Team consulted

## 2019-07-01 NOTE — ED Provider Notes (Signed)
[facility] EMERGENCY DEPARTMENT Provider Note   CSN: [id number] Arrival date & time: 07/01/19  2108     History Chief Complaint  Patient presents with  . Respiratory Distress    Billy Nelson is a 64 y.o. male with a past medical history of COPD, quadriplegia, DM 2, multiple pressure injuries, nonverbal at baseline who presents today from his nursing facility for evaluation of shortness of breath.  He is known Covid positive and had previously been admitted in the hospital.  He is on a baseline of 4 L of oxygen since discharge with his Covid.  He was noted to be more short of breath than usual and sent here.  Patient is unable to provide history as he is nonverbal.  HPI     Past Medical History:  Diagnosis Date  . COPD (chronic obstructive pulmonary disease) (H[facility])   . Dysphagia   . Quadriplegia [facility])     Patient Active Problem List   Diagnosis Date Noted  . Pressure injury of skin 06/17/2019  . Pneumonia due to COVID-19 virus 06/17/2019  . Diabetes mellitus type 2, controlled, with complications ([facility]) 06/17/2019  . Quadriplegia ([facility]) 06/17/2019  . History of intracranial hemorrhage 06/17/2019  . Essential hypertension 06/17/2019  . Acute respiratory failure with hypoxia ([facility]) 06/17/2019  . Bacterial pneumonia 06/17/2019  . Hypernatremia 06/17/2019  . Pressure ulcer 06/17/2019  . Acute respiratory failure due to COVID-19 [facility]) 06/16/2019    History reviewed. No pertinent surgical history.     History reviewed. No pertinent family history.  Social History   Tobacco Use  . Smoking status: Never Smoker  . Smokeless tobacco: Never Used  Substance Use Topics  . Alcohol use: Not Currently  . Drug use: Not Currently    Home Medications Prior to Admission medications   Medication Sig Start Date End Date Taking? Authorizing Provider  acetaminophen (TYLENOL) 325 MG tablet Place 650 mg into feeding tube every 4 (four) hours as needed for fever.     [provider]  apixaban (ELIQUIS) 2.5 MG TABS tablet Take 2.5 mg by mouth 2 (two) times daily. For 21 days 06/22/19 07/12/19  [provider]  ascorbic acid (VITAMIN C) 500 MG tablet Place 1 tablet (500 mg total) into feeding tube daily. 06/21/19   Drema Dallas, MD  aspirin 81 MG chewable tablet Place 1 tablet (81 mg total) into feeding tube daily. 06/21/19   Drema Dallas, MD  Cholecalciferol (VITAMIN D) 50 MCG (2000 UT) CAPS Take 2,000 Units by mouth daily.    [provider]  dexamethasone (DECADRON) 10 MG/ML injection Inject 0.6 mLs (6 mg total) into the vein daily. Patient not taking: Reported on 06/28/2019 06/21/19   Drema Dallas, MD  insulin aspart (NOVOLOG FLEXPEN) 100 UNIT/ML FlexPen Inject 15 Units into the skin every 4 (four) hours. Patient taking differently: Inject 0-12 Units into the skin 3 (three) times daily as needed for high blood sugar. 70-150 0 units 151-250 2 units 251-300 4 units 301-350 6 units 351-400 8 units > 400 12 units call MD 06/21/19   Drema Dallas, MD  insulin detemir (LEVEMIR) 100 unit/ml SOLN Inject 0.25 mLs (25 Units total) into the skin daily. 06/21/19   Drema Dallas, MD  Insulin Pen Needle (PEN NEEDLES 29GX1/2") 29G X MISC Use as directed 06/21/19   Drema Dallas, MD  Ipratropium-Albuterol (COMBIVENT) 20-100 MCG/ACT AERS respimat Inhale 1 puff into the lungs 4 (four) times daily. 06/21/19  Allie Bossier, MD  Nutritional Supplements (ISOSOURCE 1.5 CAL PO) by PEG Tube route. 71ml/hr  50 cc water flush. Given at 1800 and turn off at 0800.    [provider]  pantoprazole sodium (PROTONIX) 40 mg/20 mL PACK Place 20 mLs (40 mg total) into feeding tube at bedtime. 06/21/19   Allie Bossier, MD  Water For Irrigation, Sterile (FREE WATER) SOLN Place 400 mLs into feeding tube 4 (four) times daily. 06/21/19   Allie Bossier, MD  zinc oxide (BALMEX) 11.3 % CREA cream Apply 1 application topically 2 (two) times daily as needed  (redness/irritaton on buttocks). Apply to buttocks for redness or irritation     [provider]  zinc sulfate 220 (50 Zn) MG capsule Place 1 capsule (220 mg total) into feeding tube daily. 06/21/19   Allie Bossier, MD    Allergies    Patient has no known allergies.  Review of Systems   Review of Systems  Unable to perform ROS: Patient nonverbal    Physical Exam Updated Vital Signs BP (!) 123/92   Pulse (!) 103   Temp 100 F (37.8 C) (Rectal)   Resp (!) 21   SpO2 95%   Physical Exam Vitals and nursing note reviewed.  Constitutional:      Appearance: He is well-developed.     Comments: Thin, frail, chronically ill-appearing.  HENT:     Head: Atraumatic.     Comments: Significant wasting of the bilateral temporalis muscles Eyes:     General: No scleral icterus.       Right eye: No discharge.        Left eye: No discharge.     Conjunctiva/sclera: Conjunctivae normal.  Cardiovascular:     Rate and Rhythm: Normal rate and regular rhythm.  Pulmonary:     Comments: Patient has diffuse rhonchi and rails audible without use of a stethoscope.  He has mild increased work of breathing and is tachypneic. Abdominal:     General: Abdomen is flat. There is no distension.     Tenderness: There is no abdominal tenderness.  Musculoskeletal:        General: No deformity.     Cervical back: Normal range of motion and neck supple.     Right lower leg: No edema.     Left lower leg: No edema.  Skin:    General: Skin is warm and dry.     Comments: There are multiple wounds on patient's legs, which appear chronic in nature.  Neurological:     Mental Status: He is alert. Mental status is at baseline.     Motor: No abnormal muscle tone.     Comments: Patient is awake and alert.  He does not respond to commands or speak.  He will track me with his eyes.    Psychiatric:        Behavior: Behavior normal.     ED Results / Procedures / Treatments   Labs (all labs ordered are  listed, but only abnormal results are displayed) Labs Reviewed  CULTURE, BLOOD (ROUTINE X 2)  CULTURE, BLOOD (ROUTINE X 2)  BLOOD GAS, VENOUS  COMPREHENSIVE METABOLIC PANEL  CBC WITH DIFFERENTIAL/PLATELET  PROCALCITONIN  LACTIC ACID, PLASMA  LACTIC ACID, PLASMA  BRAIN NATRIURETIC PEPTIDE  D-DIMER, QUANTITATIVE (NOT AT [facility])  LACTATE DEHYDROGENASE  FERRITIN  TRIGLYCERIDES  FIBRINOGEN  C-REACTIVE PROTEIN    EKG EKG Interpretation  Date/Time:  Tuesday July 01 2019 22:19:56 EST Ventricular Rate:  107  PR Interval:    QRS Duration: 84 QT Interval:  351 QTC Calculation: 469 R Axis:   -39 Text Interpretation: Sinus tachycardia Left axis deviation Abnormal R-wave progression, early transition Abnormal T, consider ischemia, diffuse leads No significant changes from Jan 4th ecg, No STEMI Confirmed by Alvester Chou [phone]) on 07/01/2019 10:47:29 PM   Radiology DG Chest Port 1 View  Result Date: 07/01/2019 CLINICAL DATA:  COVID positive shortness of breath EXAM: PORTABLE CHEST 1 VIEW COMPARISON:  June 23, 2019 FINDINGS: The heart size and mediastinal contours stable with mild cardiomegaly. Subtle patchy airspace opacity seen within the right mid lung and left lower lung. This has progressed since the prior exam. No acute osseous abnormality. IMPRESSION: Interval progression of multifocal airspace opacities throughout both lungs, consistent with COVID pneumonia. Electronically Signed   By: Jonna Clark M.D.   On: 07/01/2019 22:08    Procedures Procedures (including critical care time)  Medications Ordered in ED Medications  0.9 %  sodium chloride infusion (has no administration in time range)  0.9 %  sodium chloride infusion (has no administration in time range)  sodium chloride flush (NS) 0.9 % injection 10-40 mL (has no administration in time range)  AeroChamber Plus Flo-Vu Large MISC 1 each (1 each Other Given 07/01/19 2221)  albuterol (VENTOLIN HFA) 108 (90 Base) MCG/ACT  inhaler 6 puff (6 puffs Inhalation Given 07/01/19 2310)    ED Course  I have reviewed the triage vital signs and the nursing notes.  Pertinent labs & imaging results that were available during my care of the patient were reviewed by me and considered in my medical decision making (see chart for details).  Clinical Course as of Jan 13 0002  Tue Jul 01, 2019  2247 Patient was seen by myself as well as PA provider.  Briefly 64 year old male with a history of intracranial hemorrhage with quadriplegia, nonverbal, recent hospitalization for COVID, on 4L [place] at baseline, presenting back to th ehospital from [facility] facility with respiratory distress.  Patient reportedly requiring greater amounts of O2.  Here in the ED is requiring approximately 10 L nasal cannula to maintain his oxygen saturations around 92%.  He has diffuse rhonchi bilaterally as well as some wheezing.  The patient is otherwise nonverbal.  He does not appear to be in acute distress.  His chest x-ray shows interval progression of his multifocal bilateral opacities.  Obtain a sepsis work-up.  Also give him some albuterol.  I anticipate with his new oxygen requirement he may need readmission.   [MT]    Clinical Course User Index [MT] Trifan, Kermit Balo, MD   MDM Rules/Calculators/A&P                     Patient presents today for evaluation of increasing shortness of breath.  On initial exam he had significant rhonchi and rales bilaterally.  He does have a history of COPD, therefore he was given albuterol with a facemask and spacer.  He had been on 4 L of oxygen however is currently requiring 7 L of oxygen.  Chest x-ray shows interval progression of the pneumonia consistent with Covid pneumonia.  Labs were ordered.  EKG without evidence of acute ischemia.  At shift change care was transferred to Dr. Wilkie Aye who will follow pending studies, re-evaulate and determine disposition.    I suspect his worsening respiratory status is a  combination of COPD and Covid.   This patient was seen as a shared visit with  Dr. Renaye Rakers.   Note: Portions of this report may have been transcribed using voice recognition software. Every effort was made to ensure accuracy; however, inadvertent computerized transcription errors may be present  Final Clinical Impression(s) / ED Diagnoses Final diagnoses:  COVID-19  COPD exacerbation [facility])    Rx / DC Orders ED Discharge Orders    None       Cristina Gong, PA-C 07/02/19 0004    Terald Sleeper, MD 07/02/19 1100

## 2019-07-02 DIAGNOSIS — U071 COVID-19: Secondary | ICD-10-CM | POA: Diagnosis not present

## 2019-07-02 LAB — CBC WITH DIFFERENTIAL/PLATELET
Abs Immature Granulocytes: 0.04 10*3/uL (ref 0.00–0.07)
Basophils Absolute: 0.1 10*3/uL (ref 0.0–0.1)
Basophils Relative: 1 %
Eosinophils Absolute: 0.1 10*3/uL (ref 0.0–0.5)
Eosinophils Relative: 1 %
HCT: 52.3 % — ABNORMAL HIGH (ref 39.0–52.0)
Hemoglobin: 15.8 g/dL (ref 13.0–17.0)
Immature Granulocytes: 0 %
Lymphocytes Relative: 14 %
Lymphs Abs: 1.5 10*3/uL (ref 0.7–4.0)
MCH: 26.2 pg (ref 26.0–34.0)
MCHC: 30.2 g/dL (ref 30.0–36.0)
MCV: 86.7 fL (ref 80.0–100.0)
Monocytes Absolute: 0.9 10*3/uL (ref 0.1–1.0)
Monocytes Relative: 8 %
Neutro Abs: 8 10*3/uL — ABNORMAL HIGH (ref 1.7–7.7)
Neutrophils Relative %: 76 %
Platelets: 325 10*3/uL (ref 150–400)
RBC: 6.03 MIL/uL — ABNORMAL HIGH (ref 4.22–5.81)
RDW: 20.3 % — ABNORMAL HIGH (ref 11.5–15.5)
WBC: 10.5 10*3/uL (ref 4.0–10.5)
nRBC: 0 % (ref 0.0–0.2)

## 2019-07-02 LAB — COMPREHENSIVE METABOLIC PANEL
ALT: 66 U/L — ABNORMAL HIGH (ref 0–44)
AST: 47 U/L — ABNORMAL HIGH (ref 15–41)
Albumin: 2.8 g/dL — ABNORMAL LOW (ref 3.5–5.0)
Alkaline Phosphatase: 88 U/L (ref 38–126)
Anion gap: 8 (ref 5–15)
BUN: 24 mg/dL — ABNORMAL HIGH (ref 8–23)
CO2: 28 mmol/L (ref 22–32)
Calcium: 9.2 mg/dL (ref 8.9–10.3)
Chloride: 116 mmol/L — ABNORMAL HIGH (ref 98–111)
Creatinine, Ser: 0.79 mg/dL (ref 0.61–1.24)
GFR calc Af Amer: >60 mL/min (ref >60)
GFR calc non Af Amer: >60 mL/min (ref >60)
Glucose, Bld: 134 mg/dL — ABNORMAL HIGH (ref 70–99)
Potassium: 4.1 mmol/L (ref 3.5–5.1)
Sodium: 152 mmol/L — ABNORMAL HIGH (ref 135–145)
Total Bilirubin: 1.3 mg/dL — ABNORMAL HIGH (ref 0.3–1.2)
Total Protein: 7.4 g/dL (ref 6.5–8.1)

## 2019-07-02 LAB — FERRITIN: Ferritin: 195 ng/mL (ref 24–336)

## 2019-07-02 LAB — FIBRINOGEN: Fibrinogen: 752 mg/dL — ABNORMAL HIGH (ref 210–475)

## 2019-07-02 LAB — LACTIC ACID, PLASMA
Lactic Acid, Venous: 2.9 mmol/L (ref 0.5–1.9)
Lactic Acid, Venous: 2.1 mmol/L (ref 0.5–1.9)

## 2019-07-02 LAB — TRIGLYCERIDES: Triglycerides: 123 mg/dL (ref <150)

## 2019-07-02 LAB — LACTATE DEHYDROGENASE: LDH: 461 U/L — ABNORMAL HIGH (ref 98–192)

## 2019-07-02 LAB — PROCALCITONIN: Procalcitonin: <0.1 ng/mL

## 2019-07-02 LAB — BRAIN NATRIURETIC PEPTIDE: B Natriuretic Peptide: 45 pg/mL (ref 0.0–100.0)

## 2019-07-02 LAB — D-DIMER, QUANTITATIVE: D-Dimer, Quant: 2.29 ug/mL-FEU — ABNORMAL HIGH (ref 0.00–0.50)

## 2019-07-02 LAB — C-REACTIVE PROTEIN: CRP: 2.6 mg/dL — ABNORMAL HIGH (ref <1.0)

## 2019-07-02 NOTE — Discharge Instructions (Addendum)
You were seen today for low oxygen saturations.  You were weaned back.  Your O2 requirement is likely still related to your ongoing COVID-19 infection.  Continue oxygen supplementation.

## 2019-07-02 NOTE — ED Notes (Signed)
Pt's O2 lowered to 4L [place] per MD Horton's order. Will continue to monitor pt & adjust O2 to continue keeping pt comfortable

## 2019-07-02 NOTE — ED Notes (Signed)
Report called to [facility]. Pt awaiting transport

## 2019-07-02 NOTE — ED Provider Notes (Signed)
Patient signed out pending work-up.  Recent diagnosis and admission for COVID-19 in December.  He has had several ED evaluations since then for desaturations at the nursing facility.  On my evaluation he appears to be at his baseline.  He is in no respiratory distress.  Reported increased oxygen requirement.  Lab work shows persistent hypernatremia likely related to dehydration.  He was given fluids.  Lactate is minimally elevated at 2.9.  Chest x-ray shows interval progression of multifocal pneumonia.  This is likely related to COVID-19.  His other COVID-19 screening lab work is at baseline when compared to prior.  I have requested nursing attempt to wean oxygen.  3:49 AM Patient weaned back to 4 L and satting 93 to 94%.  He remains in no acute distress.  He was given fluids and heart rate has improved into the 80s.  He had a mild lactic acidosis but no signs or symptoms of overt sepsis or septic shock.  Feel his x-ray findings and oxygen requirement are likely related to his ongoing COVID-19 infection.  He was hospitalized and received appropriate steroids and remdesivir.  Feel he is safe for discharge back to his living facility.  Physical Exam  BP (!) 129/93   Pulse 94   Temp 100 F (37.8 C) (Rectal)   Resp 14   SpO2 98%   Physical Exam   Chronically ill-appearing, contractured Will track in the room Dry mucous membranes Coarse breath sounds bilaterally, no respiratory distress or accessory muscle use  ED Course/Procedures   Clinical Course as of Jul 01 208  Tue Jul 01, 2019  2247 Patient was seen by myself as well as PA provider.  Briefly 64 year old male with a history of intracranial hemorrhage with quadriplegia, nonverbal, recent hospitalization for COVID, on 4L [place] at baseline, presenting back to th [facility]ospital from [facility] facility with respiratory distress.  Patient reportedly requiring greater amounts of O2.  Here in the ED is requiring approximately 10 L nasal cannula to  maintain his oxygen saturations around 92%.  He has diffuse rhonchi bilaterally as well as some wheezing.  The patient is otherwise nonverbal.  He does not appear to be in acute distress.  His chest x-ray shows interval progression of his multifocal bilateral opacities.  Obtain a sepsis work-up.  Also give him some albuterol.  I anticipate with his new oxygen requirement he may need readmission.   [MT]    Clinical Course User Index [MT] Trifan, Kermit Balo, MD    Procedures  MDM        Shon Baton, MD 07/02/19 [phone]

## 2019-07-04 LAB — CULTURE, BLOOD (ROUTINE X 2): Special Requests: ADEQUATE

## 2019-07-04 NOTE — Telephone Encounter (Signed)
Received blood culture 1/2 aerobic bottle only positive for gram positive cocci. Called nursing facility for update on patient's status. Nurse states patient doing well; states on 6 liters oxygen [place]; states VS stable. Advised to return to ED for any changes in status or concerns. Will follow up once blood culture report is finalized. Caregiver verbalized understanding.

## 2020-01-18 DEATH — deceased

## 2020-12-14 IMAGING — DX DG CHEST 1V PORT
1 series · 1 of 1 positions shown · non-contrast
Comparison: No pertinent prior studies available for comparison.

CLINICAL DATA: History of COVID, altered mental status, COVID
positive

EXAM:
PORTABLE CHEST 1 VIEW

[chest]
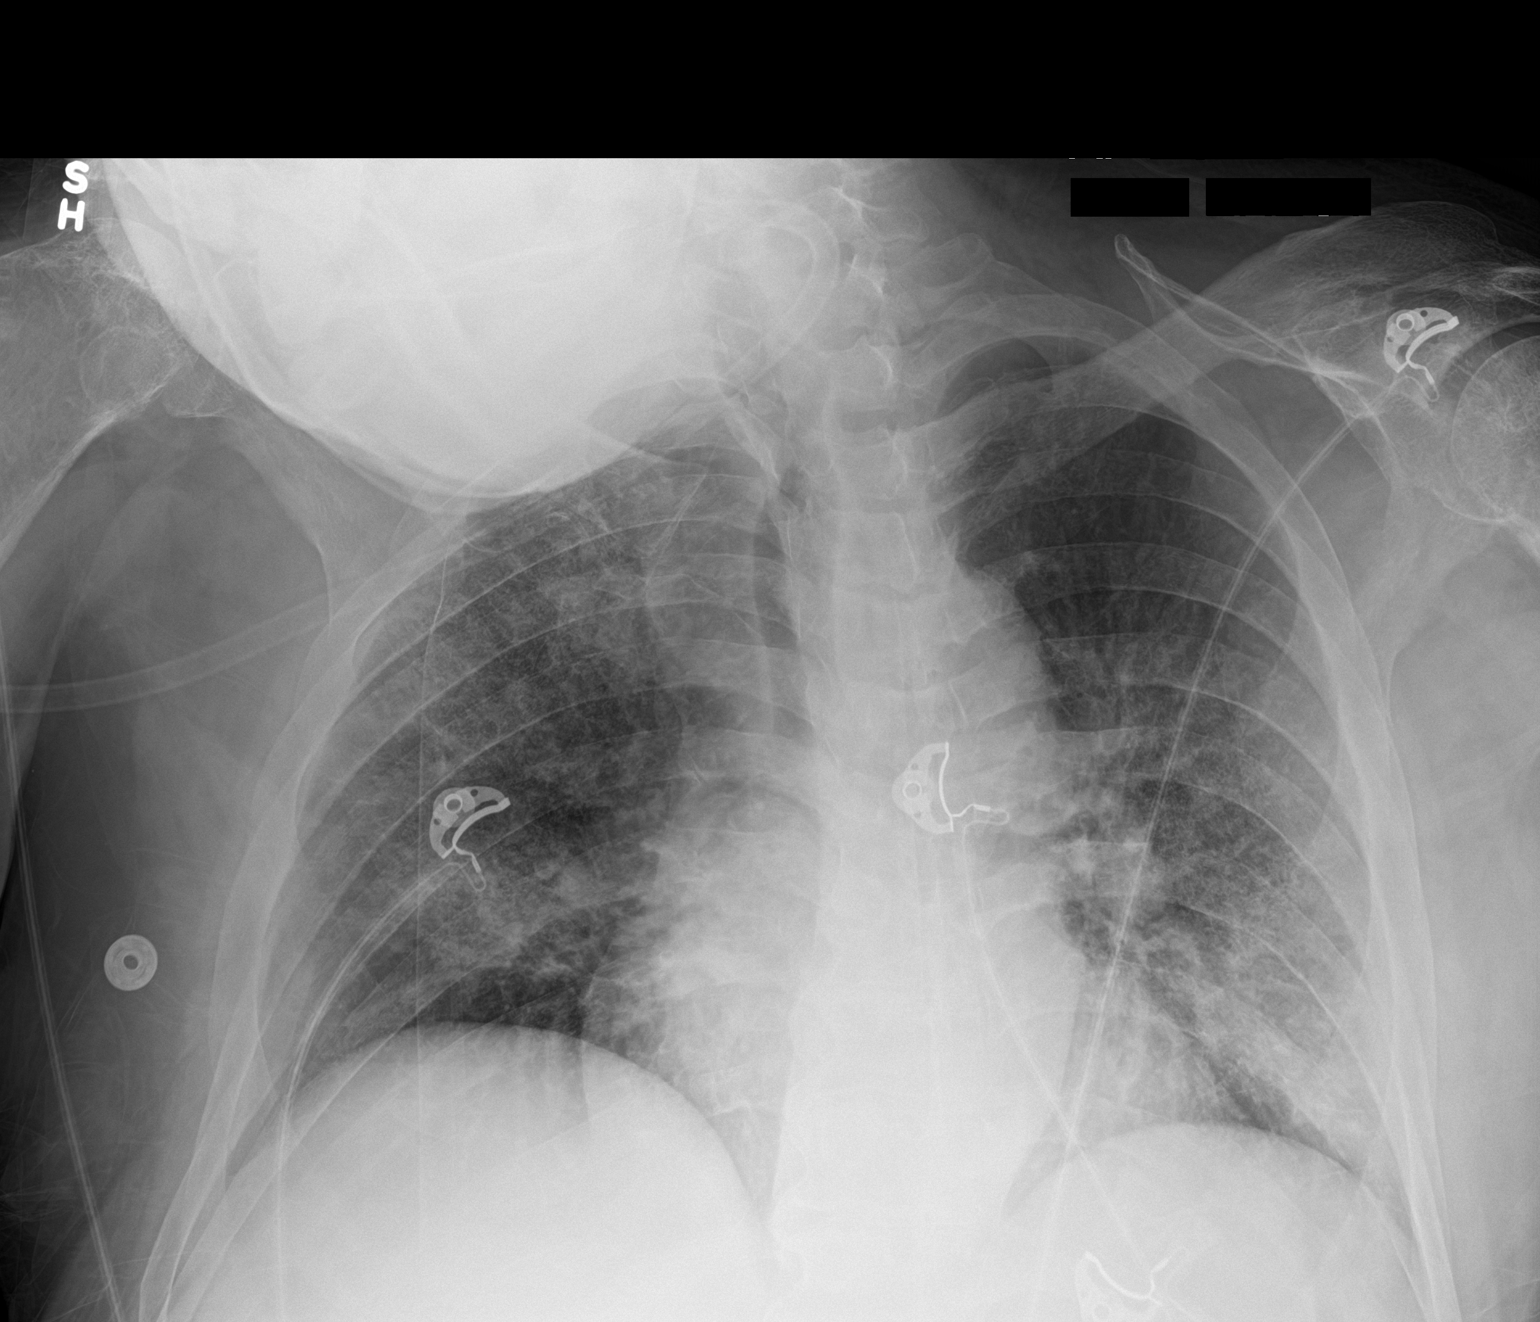

[1 of 1 positions shown; findings below may reference images not displayed]

FINDINGS: Limited evaluation due to artifacts overlying the right hemithorax.
Additionally, the patient's chin obscures the right lung apex and
the patient is significantly rotated to the right.

Heart size within normal limits. Ill-defined airspace opacities
throughout both lungs. No evidence of pneumothorax within described
limitations. No sizable pleural effusion. No acute bony abnormality.
IMPRESSION: Limited examination as described.

Ill-defined airspace opacities throughout both lungs consistent with
pneumonia given provided history.

## 2020-12-20 IMAGING — DX DG CHEST 1V PORT
1 series · 1 of 1 positions shown · non-contrast
Comparison: Prior radiograph from 06/19/2019.

CLINICAL DATA: Initial evaluation for acute COVID infection.

EXAM:
PORTABLE CHEST 1 VIEW

[chest ap]
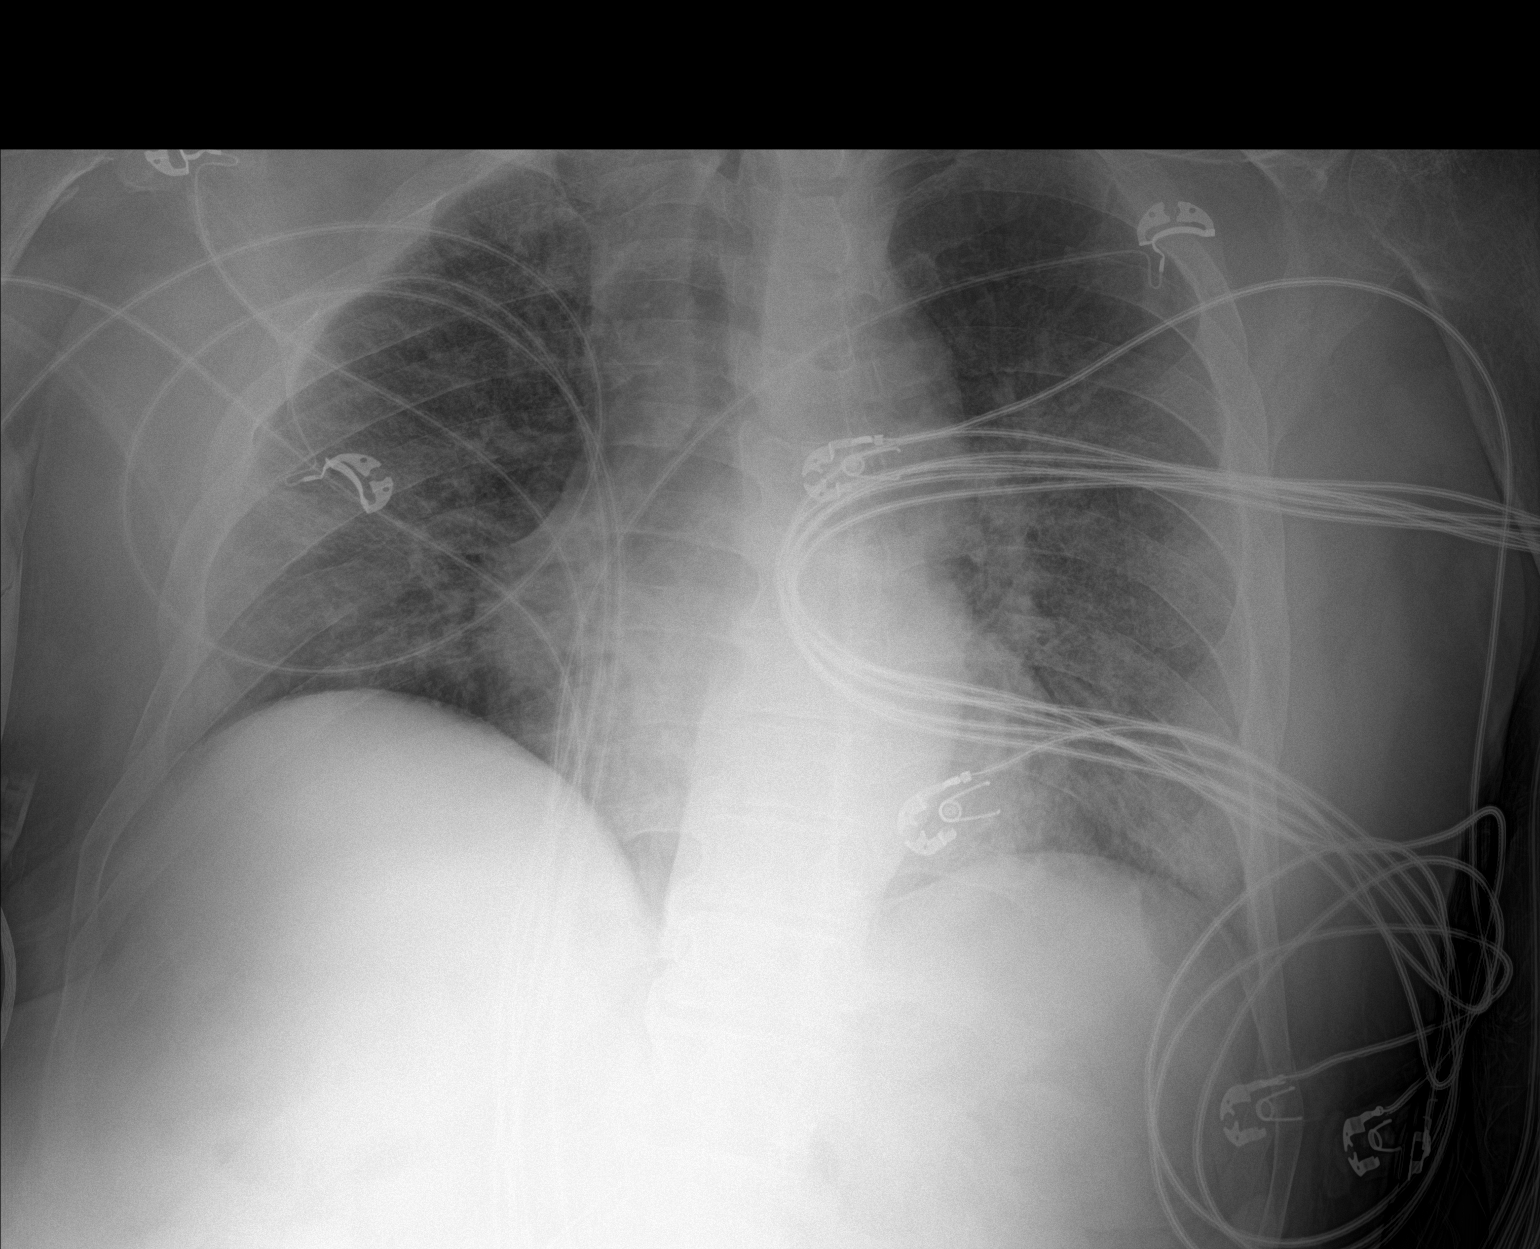

[1 of 1 positions shown; findings below may reference images not displayed]

FINDINGS: Mild cardiomegaly, stable. Mediastinal silhouette within normal
limits.

Lungs hypoinflated. Prominent bilateral interstitial airspace
opacities, presumably related to history of viral pneumonitis,
mildly improved as compared to previous. No frank pulmonary edema or
pleural effusion. No consolidative infiltrate. No pneumothorax.

No acute osseous abnormality.
IMPRESSION: Prominent bilateral interstitial opacities, presumably related to
history of COVID infection. Overall, appearance is mildly improved
from previous.

## 2020-12-26 IMAGING — DX DG ABD PORTABLE 1V
1 series · 1 of 1 positions shown · non-contrast
Comparison: None.

CLINICAL DATA: Feeding tube placement.

EXAM:
PORTABLE ABDOMEN - 1 VIEW

[abdomen kub]
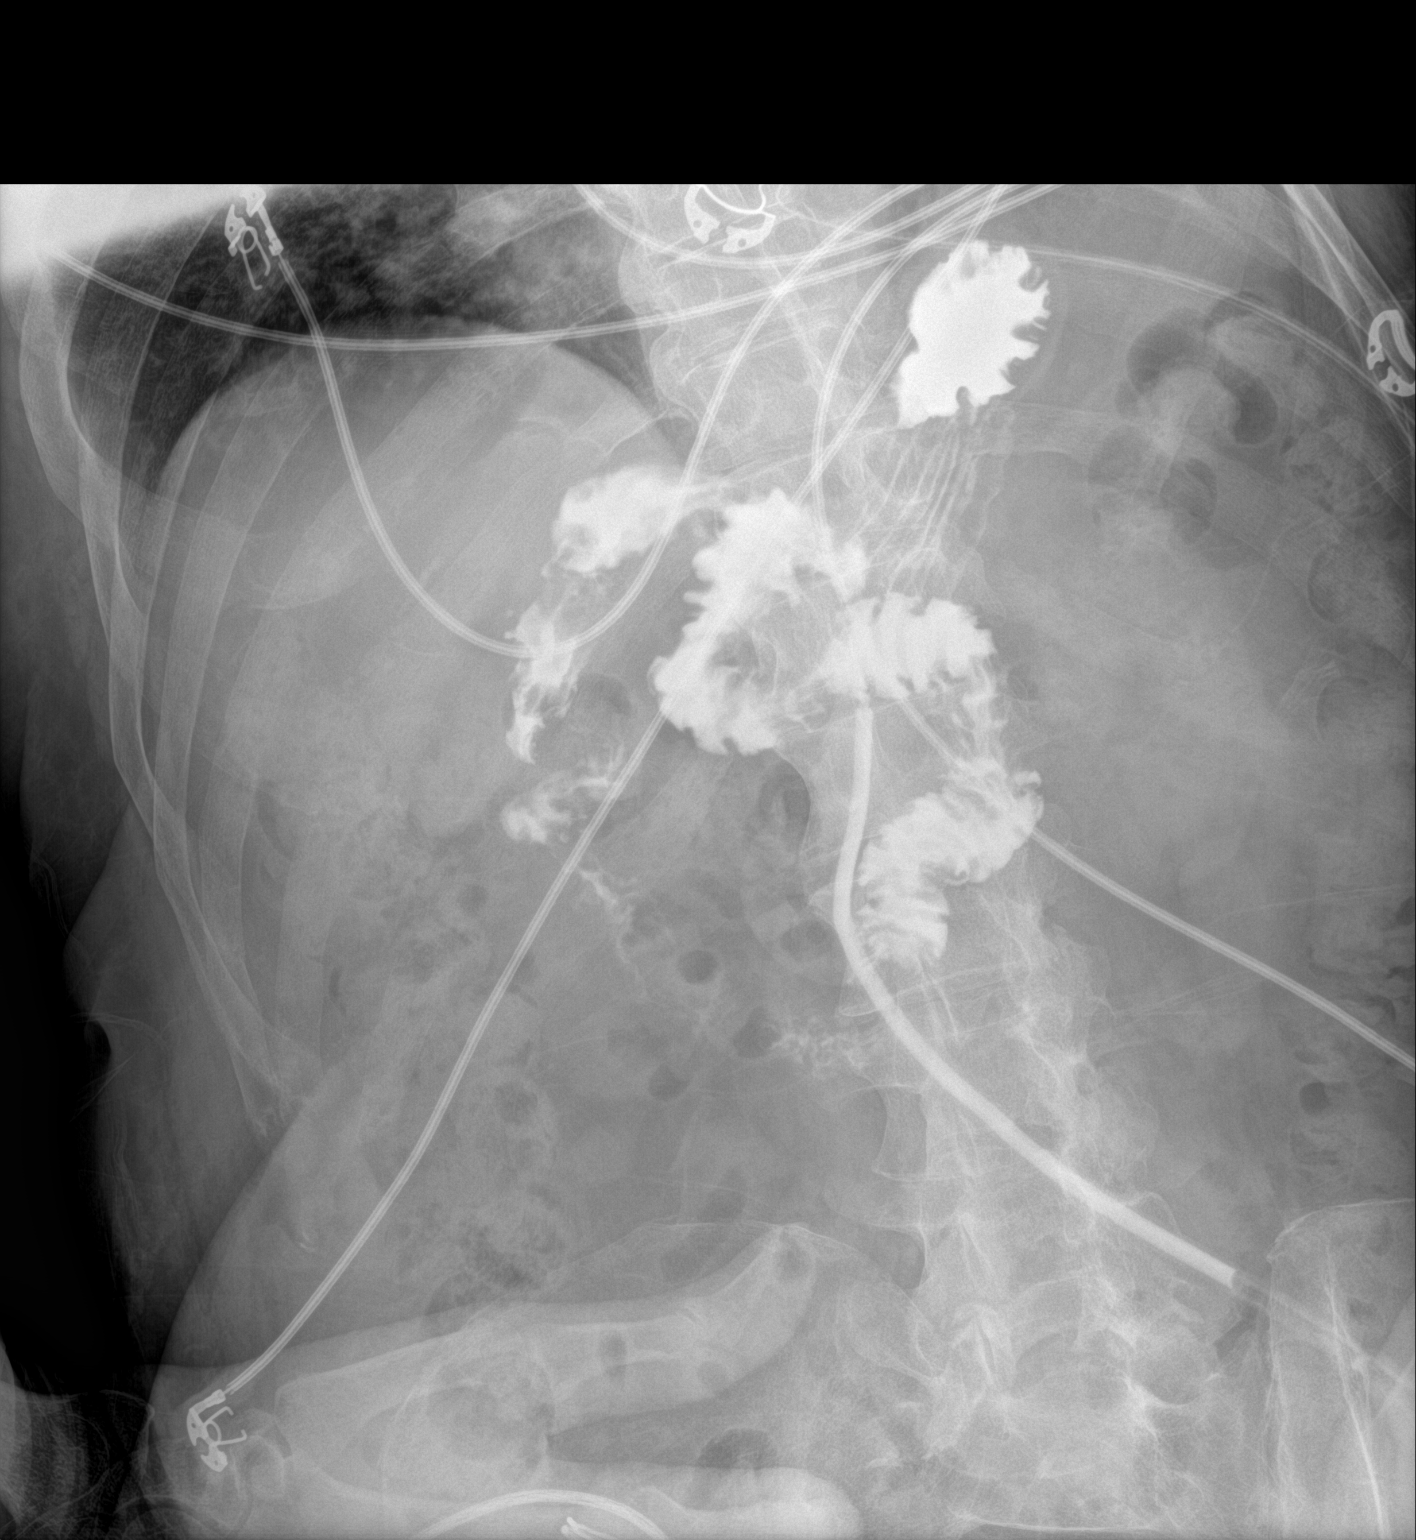

[1 of 1 positions shown; findings below may reference images not displayed]

FINDINGS: Distal tip of the feeding tube is not well seen due to overlapping
contrast. However, there is contrast in the stomach and the
duodenum. No evidence of contrast leak identified.
IMPRESSION: 1. The distal tip of the feeding tube is difficult to see due to
overlapping contrast. However, contrast is appropriately seen in the
stomach and duodenum. There is no evidence of extravasated or
leaking contrast. The findings are most consistent with an
appropriately placed feeding tube.

## 2020-12-29 IMAGING — DX DG CHEST 1V PORT
1 series · 1 of 1 positions shown · non-contrast
Comparison: June 23, 2019

CLINICAL DATA: COVID positive shortness of breath

EXAM:
PORTABLE CHEST 1 VIEW

[chest ap]
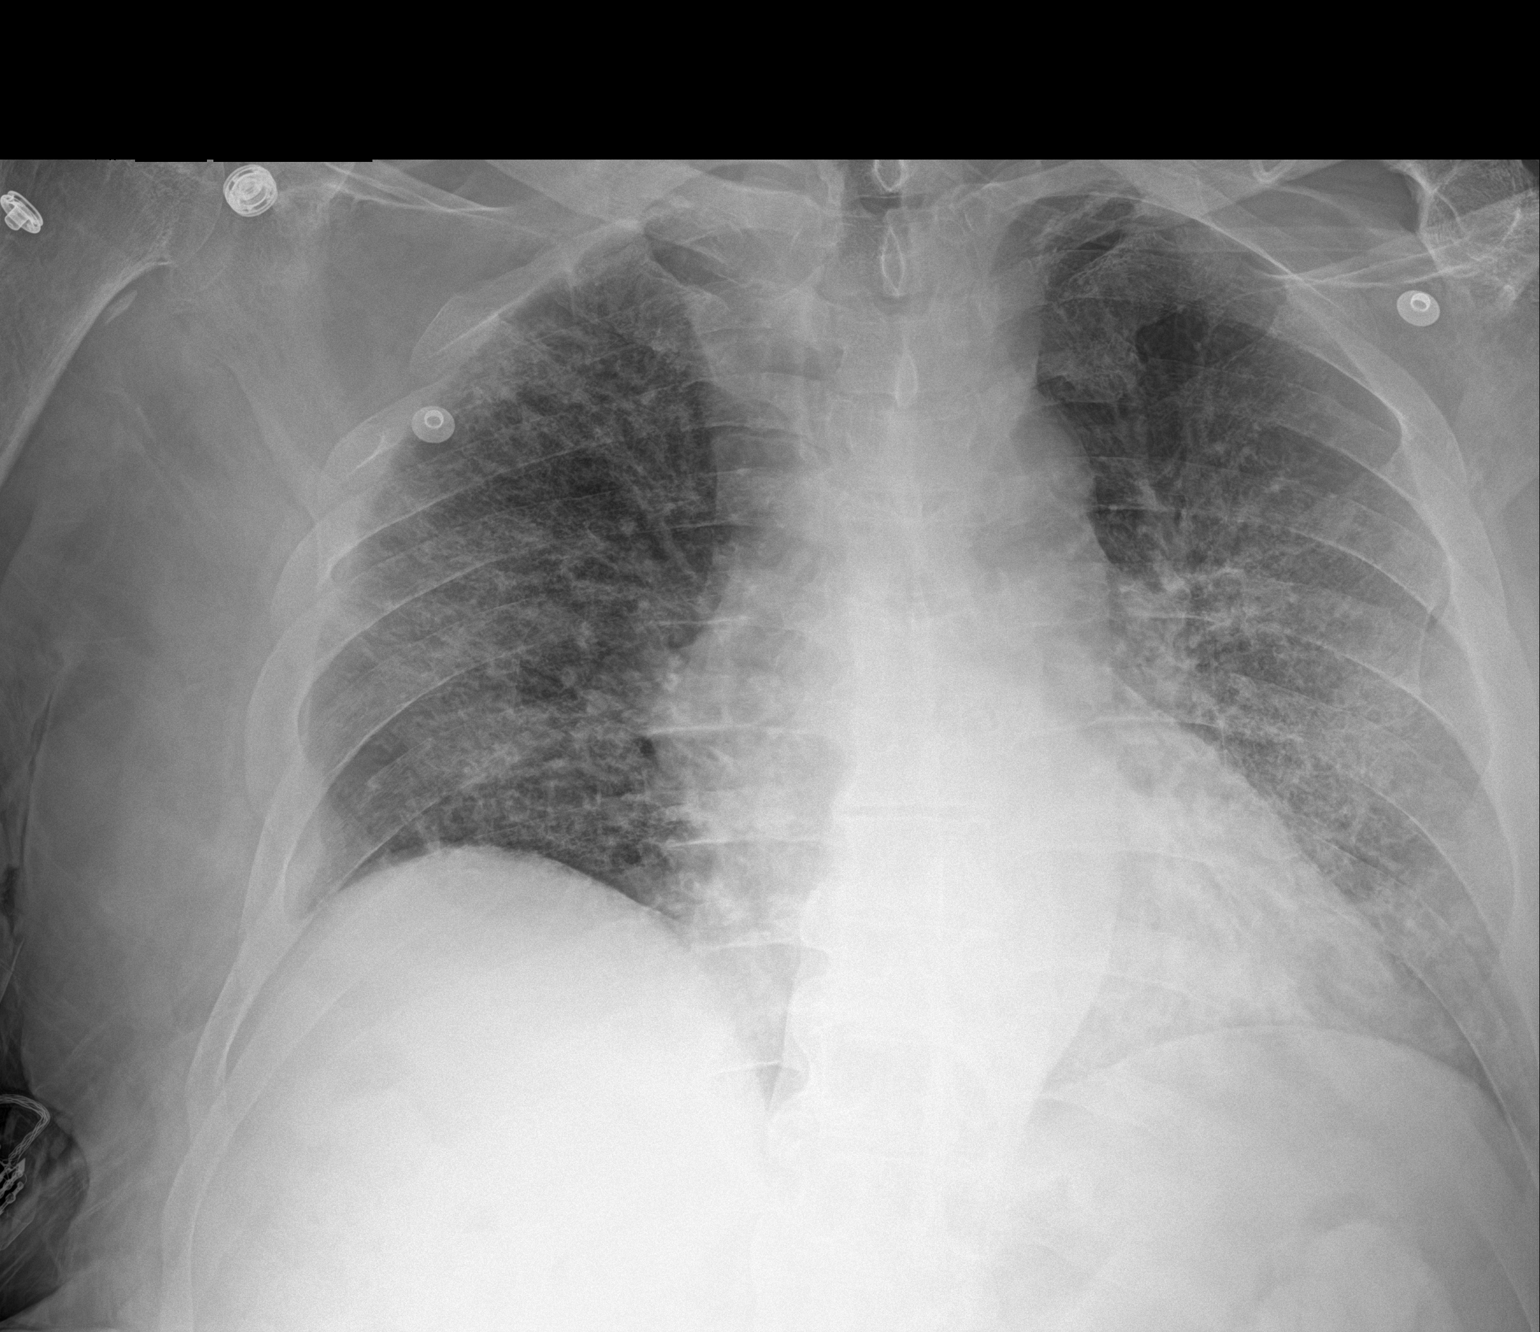

[1 of 1 positions shown; findings below may reference images not displayed]

FINDINGS: The heart size and mediastinal contours stable with mild
cardiomegaly. Subtle patchy airspace opacity seen within the right
mid lung and left lower lung. This has progressed since the prior
exam. No acute osseous abnormality.
IMPRESSION: Interval progression of multifocal airspace opacities throughout
both lungs, consistent with COVID pneumonia.
